# Patient Record
Sex: Female | Born: 1985 | Race: Asian | Hispanic: No | Marital: Married | Smoking: Never smoker
Health system: Southern US, Community
[De-identification: ages and names within clinical notes are randomized; demographics above are authoritative.]

## PROBLEM LIST (undated history)

## (undated) DIAGNOSIS — Z789 Other specified health status: Secondary | ICD-10-CM

## (undated) DIAGNOSIS — Z3183 Encounter for assisted reproductive fertility procedure cycle: Secondary | ICD-10-CM

## (undated) HISTORY — PX: POLYPECTOMY: SHX149

## (undated) HISTORY — DX: Other specified health status: Z78.9

---

## 2019-07-17 DIAGNOSIS — Z3141 Encounter for fertility testing: Secondary | ICD-10-CM | POA: Diagnosis not present

## 2019-07-17 DIAGNOSIS — N979 Female infertility, unspecified: Secondary | ICD-10-CM | POA: Diagnosis not present

## 2019-07-17 DIAGNOSIS — Z3183 Encounter for assisted reproductive fertility procedure cycle: Secondary | ICD-10-CM | POA: Diagnosis not present

## 2019-07-17 DIAGNOSIS — Z113 Encounter for screening for infections with a predominantly sexual mode of transmission: Secondary | ICD-10-CM | POA: Diagnosis not present

## 2019-07-18 DIAGNOSIS — N979 Female infertility, unspecified: Secondary | ICD-10-CM | POA: Diagnosis not present

## 2019-07-26 DIAGNOSIS — Z3141 Encounter for fertility testing: Secondary | ICD-10-CM | POA: Diagnosis not present

## 2019-08-14 DIAGNOSIS — Z113 Encounter for screening for infections with a predominantly sexual mode of transmission: Secondary | ICD-10-CM | POA: Diagnosis not present

## 2019-08-31 DIAGNOSIS — Z3200 Encounter for pregnancy test, result unknown: Secondary | ICD-10-CM | POA: Diagnosis not present

## 2019-08-31 DIAGNOSIS — Z3201 Encounter for pregnancy test, result positive: Secondary | ICD-10-CM | POA: Diagnosis not present

## 2019-09-04 DIAGNOSIS — Z3201 Encounter for pregnancy test, result positive: Secondary | ICD-10-CM | POA: Diagnosis not present

## 2019-09-04 DIAGNOSIS — Z3200 Encounter for pregnancy test, result unknown: Secondary | ICD-10-CM | POA: Diagnosis not present

## 2019-09-15 DIAGNOSIS — Z3200 Encounter for pregnancy test, result unknown: Secondary | ICD-10-CM | POA: Diagnosis not present

## 2019-10-02 DIAGNOSIS — Z3201 Encounter for pregnancy test, result positive: Secondary | ICD-10-CM | POA: Diagnosis not present

## 2019-10-23 DIAGNOSIS — O219 Vomiting of pregnancy, unspecified: Secondary | ICD-10-CM | POA: Diagnosis not present

## 2019-10-23 DIAGNOSIS — Z3400 Encounter for supervision of normal first pregnancy, unspecified trimester: Secondary | ICD-10-CM | POA: Diagnosis not present

## 2019-10-23 DIAGNOSIS — O09511 Supervision of elderly primigravida, first trimester: Secondary | ICD-10-CM | POA: Diagnosis not present

## 2019-10-23 DIAGNOSIS — Z3A11 11 weeks gestation of pregnancy: Secondary | ICD-10-CM | POA: Diagnosis not present

## 2019-10-23 DIAGNOSIS — O09811 Supervision of pregnancy resulting from assisted reproductive technology, first trimester: Secondary | ICD-10-CM | POA: Diagnosis not present

## 2019-10-23 DIAGNOSIS — Z3689 Encounter for other specified antenatal screening: Secondary | ICD-10-CM | POA: Diagnosis not present

## 2019-10-24 ENCOUNTER — Inpatient Hospital Stay (HOSPITAL_COMMUNITY): Admission: AD | Admit: 2019-10-24 | Payer: 59 | Source: Home / Self Care | Admitting: Obstetrics & Gynecology

## 2019-10-24 DIAGNOSIS — O09511 Supervision of elderly primigravida, first trimester: Secondary | ICD-10-CM | POA: Diagnosis not present

## 2019-10-24 DIAGNOSIS — Z3689 Encounter for other specified antenatal screening: Secondary | ICD-10-CM | POA: Diagnosis not present

## 2019-10-24 LAB — OB RESULTS CONSOLE HIV ANTIBODY (ROUTINE TESTING): HIV: NONREACTIVE

## 2019-10-24 LAB — OB RESULTS CONSOLE GC/CHLAMYDIA
Chlamydia: NEGATIVE
Gonorrhea: NEGATIVE

## 2019-10-24 LAB — OB RESULTS CONSOLE HEPATITIS B SURFACE ANTIGEN: Hepatitis B Surface Ag: NEGATIVE

## 2019-10-24 LAB — OB RESULTS CONSOLE ANTIBODY SCREEN: Antibody Screen: NEGATIVE

## 2019-10-24 LAB — OB RESULTS CONSOLE ABO/RH: RH Type: POSITIVE

## 2019-10-24 LAB — OB RESULTS CONSOLE RPR: RPR: NONREACTIVE

## 2019-10-24 LAB — OB RESULTS CONSOLE RUBELLA ANTIBODY, IGM: Rubella: IMMUNE

## 2019-10-29 ENCOUNTER — Other Ambulatory Visit: Payer: Self-pay

## 2019-10-29 ENCOUNTER — Inpatient Hospital Stay (HOSPITAL_COMMUNITY)
Admission: AD | Admit: 2019-10-29 | Discharge: 2019-10-29 | Disposition: A | Discharge status: Home / Self Care | Payer: 59 | Attending: Obstetrics & Gynecology | Admitting: Obstetrics & Gynecology

## 2019-10-29 ENCOUNTER — Encounter (HOSPITAL_COMMUNITY): Payer: Self-pay

## 2019-10-29 DIAGNOSIS — O219 Vomiting of pregnancy, unspecified: Secondary | ICD-10-CM | POA: Insufficient documentation

## 2019-10-29 DIAGNOSIS — Z79899 Other long term (current) drug therapy: Secondary | ICD-10-CM | POA: Diagnosis not present

## 2019-10-29 DIAGNOSIS — Z3A12 12 weeks gestation of pregnancy: Secondary | ICD-10-CM | POA: Insufficient documentation

## 2019-10-29 HISTORY — DX: Encounter for assisted reproductive fertility procedure cycle: Z31.83

## 2019-10-29 LAB — BASIC METABOLIC PANEL
Anion gap: 9 (ref 5–15)
BUN: 6 mg/dL (ref 6–20)
CO2: 20 mmol/L — ABNORMAL LOW (ref 22–32)
Calcium: 9.5 mg/dL (ref 8.9–10.3)
Chloride: 110 mmol/L (ref 98–111)
Creatinine, Ser: 0.57 mg/dL (ref 0.44–1.00)
GFR calc Af Amer: >60 mL/min (ref >60)
GFR calc non Af Amer: >60 mL/min (ref >60)
Glucose, Bld: 99 mg/dL (ref 70–99)
Potassium: 3.5 mmol/L (ref 3.5–5.1)
Sodium: 139 mmol/L (ref 135–145)

## 2019-10-29 LAB — CBC
HCT: 36.3 % (ref 36.0–46.0)
Hemoglobin: 12.4 g/dL (ref 12.0–15.0)
MCH: 29.5 pg (ref 26.0–34.0)
MCHC: 34.2 g/dL (ref 30.0–36.0)
MCV: 86.2 fL (ref 80.0–100.0)
Platelets: 308 10*3/uL (ref 150–400)
RBC: 4.21 MIL/uL (ref 3.87–5.11)
RDW: 12.7 % (ref 11.5–15.5)
WBC: 13.3 10*3/uL — ABNORMAL HIGH (ref 4.0–10.5)
nRBC: 0 % (ref 0.0–0.2)

## 2019-10-29 LAB — URINALYSIS, ROUTINE W REFLEX MICROSCOPIC
Bacteria, UA: NONE SEEN
Bilirubin Urine: NEGATIVE
Glucose, UA: NEGATIVE mg/dL
Hgb urine dipstick: NEGATIVE
Ketones, ur: 80 mg/dL — AB
Leukocytes,Ua: NEGATIVE
Nitrite: NEGATIVE
Protein, ur: 100 mg/dL — AB
Specific Gravity, Urine: 1.027 (ref 1.005–1.030)
pH: 7 (ref 5.0–8.0)

## 2019-10-29 MED ORDER — SCOPOLAMINE 1 MG/3DAYS TD PT72: 1.0000 | MEDICATED_PATCH | TRANSDERMAL | 12 refills | Status: DC

## 2019-10-29 MED ORDER — DOCUSATE SODIUM 250 MG PO CAPS
250.0000 mg | ORAL_CAPSULE | Freq: Every day | ORAL | 0 refills | Status: DC
Start: 2019-10-29 — End: 2020-05-07

## 2019-10-29 MED ORDER — M.V.I. ADULT IV INJ
Freq: Once | INTRAVENOUS | Status: AC
  Filled 2019-10-29: qty 10

## 2019-10-29 MED ORDER — ONDANSETRON HCL 4 MG/2ML IJ SOLN
4.0000 mg | Freq: Once | INTRAMUSCULAR | Status: AC
  Administered 2019-10-29: 4 mg via INTRAVENOUS
  Filled 2019-10-29: qty 2

## 2019-10-29 MED ORDER — PROMETHAZINE HCL 25 MG/ML IJ SOLN
25.0000 mg | Freq: Once | INTRAMUSCULAR | Status: AC
  Administered 2019-10-29: 25 mg via INTRAVENOUS
  Filled 2019-10-29: qty 1

## 2019-10-29 MED ORDER — SCOPOLAMINE 1 MG/3DAYS TD PT72
1.0000 | MEDICATED_PATCH | TRANSDERMAL | Status: DC
  Administered 2019-10-29: 1.5 mg via TRANSDERMAL
  Filled 2019-10-29: qty 1

## 2019-10-29 MED ORDER — LACTATED RINGERS IV BOLUS
1000.0000 mL | Freq: Once | INTRAVENOUS | Status: AC
  Administered 2019-10-29: 1000 mL via INTRAVENOUS

## 2019-10-29 MED ORDER — ONDANSETRON 8 MG PO TBDP
8.0000 mg | ORAL_TABLET | Freq: Three times a day (TID) | ORAL | 1 refills | Status: DC | PRN
Start: 2019-10-29 — End: 2020-05-07

## 2019-10-29 MED ORDER — FAMOTIDINE IN NACL 20-0.9 MG/50ML-% IV SOLN
20.0000 mg | Freq: Once | INTRAVENOUS | Status: AC
  Administered 2019-10-29: 20 mg via INTRAVENOUS
  Filled 2019-10-29: qty 50

## 2019-10-29 MED ORDER — PROMETHAZINE HCL 25 MG PO TABS
25.0000 mg | ORAL_TABLET | Freq: Four times a day (QID) | ORAL | 1 refills | Status: DC | PRN
Start: 2019-10-29 — End: 2020-05-07

## 2019-10-29 MED ORDER — FAMOTIDINE 20 MG PO TABS
20.0000 mg | ORAL_TABLET | Freq: Two times a day (BID) | ORAL | 1 refills | Status: DC
Start: 2019-10-29 — End: 2020-05-07

## 2019-10-29 NOTE — Discharge Instructions (Signed)
When to take your medicine next:   Zofran- 12:40am, take every 4-6 hours  Phenergan- 02:00am, take every 6 hours Pepcid- 08:00am, take twice a day at morning and bedtime Reglan- take at any time tonight if you feel like you need it and then every 6 hours.  Please take your medicine on a schedule until you feel like your nausea is getting better and you are able to eat and drink some. Please take all of the medicines around the clock.  Safe Medications in Pregnancy   Acne: Benzoyl Peroxide Salicylic Acid  Backache/Headache: Tylenol: 2 regular strength every 4 hours OR              2 Extra strength every 6 hours  Colds/Coughs/Allergies: Benadryl (alcohol free) 25 mg every 6 hours as needed Breath right strips Claritin Cepacol throat lozenges Chloraseptic throat spray Cold-Eeze- up to three times per day Cough drops, alcohol free Flonase (by prescription only) Guaifenesin Mucinex Robitussin DM (plain only, alcohol free) Saline nasal spray/drops Sudafed (pseudoephedrine) & Actifed ** use only after [redacted] weeks gestation and if you do not have high blood pressure Tylenol Vicks Vaporub Zinc lozenges Zyrtec   Constipation: Colace Ducolax suppositories Fleet enema Glycerin suppositories Metamucil Milk of magnesia Miralax Senokot Smooth move tea  Diarrhea: Kaopectate Imodium A-D  *NO pepto Bismol  Hemorrhoids: Anusol Anusol HC Preparation H Tucks  Indigestion: Tums Maalox Mylanta Zantac  Pepcid  Insomnia: Benadryl (alcohol free) 25mg  every 6 hours as needed Tylenol PM Unisom, no Gelcaps  Leg Cramps: Tums MagGel  Nausea/Vomiting:  Bonine Dramamine Emetrol Ginger extract Sea bands Meclizine  Nausea medication to take during pregnancy:  Unisom (doxylamine succinate 25 mg tablets) Take one tablet daily at bedtime. If symptoms are not adequately controlled, the dose can be increased to a maximum recommended dose of two tablets daily (1/2 tablet in  the morning, 1/2 tablet mid-afternoon and one at bedtime). Vitamin B6 100mg  tablets. Take one tablet twice a day (up to 200 mg per day).  Skin Rashes: Aveeno products Benadryl cream or 25mg  every 6 hours as needed Calamine Lotion 1% cortisone cream  Yeast infection: Gyne-lotrimin 7 Monistat 7   **If taking multiple medications, please check labels to avoid duplicating the same active ingredients **take medication as directed on the label ** Do not exceed 4000 mg of tylenol in 24 hours **Do not take medications that contain aspirin or ibuprofen

## 2019-10-29 NOTE — MAU Provider Note (Addendum)
History     CSN: [id number]  Arrival date and time: 10/29/19 7867   First Provider Initiated Contact with Patient 10/29/19 1935      Chief Complaint  Patient presents with  . Emesis During Pregnancy   HPI Amanda Cameron is a 34 y.o. G1P0 at [redacted]w[redacted]d who presents to MAU with chief complaint of recurrent severe vomiting. Patient estimates that she has vomited 25 times today. She has been prescribed Zofran and Reglan but has not experienced relief. She attempted to eat rice last night but was unable tolerate it.  She denies abdominal pain, vaginal bleeding, dysuria, fever or recent illness. She receives prenatal care with [facility] and s/p New OB visit 10/23/2019.  OB History    Gravida  1   Para      Term      Preterm      AB      Living        SAB      TAB      Ectopic      Multiple      Live Births              Past Medical History:  Diagnosis Date  . In vitro fertilization     Past Surgical History:  Procedure Laterality Date  . POLYPECTOMY     vaginal    Family History  Problem Relation Age of Onset  . Diabetes Mother   . Diabetes Father     Social History   Tobacco Use  . Smoking status: Never Smoker  . Smokeless tobacco: Never Used  Vaping Use  . Vaping Use: Never used  Substance Use Topics  . Alcohol use: Never  . Drug use: Never    Allergies: No Known Allergies  Medications Prior to Admission  Medication Sig Dispense Refill Last Dose  . clindamycin (CLEOCIN) 2 % vaginal cream Place 1 Applicatorful vaginally at bedtime.   10/28/2019 at Unknown time  . metoCLOPramide (REGLAN) 10 MG tablet Take 10 mg by mouth in the morning, at noon, and at bedtime.   10/29/2019 at Unknown time  . ondansetron (ZOFRAN) 4 MG tablet Take 4 mg by mouth every 8 (eight) hours as needed for nausea or vomiting.   10/29/2019 at Unknown time    Review of Systems  Gastrointestinal: Positive for constipation, nausea and vomiting. Negative for abdominal pain.   Genitourinary: Negative for vaginal bleeding.  Musculoskeletal: Negative for back pain.  Neurological: Negative for dizziness.  All other systems reviewed and are negative.  Physical Exam   Blood pressure 121/75, pulse 71, temperature 98 F (36.7 C), temperature source Oral, resp. rate 18, height 5\' 2"  (1.575 m), weight 84.4 kg, SpO2 100 %.  Physical Exam Vitals and nursing note reviewed. Exam conducted with a chaperone present.  Constitutional:      Appearance: Normal appearance.  HENT:     Mouth/Throat:     Mouth: Mucous membranes are dry.  Cardiovascular:     Rate and Rhythm: Normal rate.     Pulses: Normal pulses.  Pulmonary:     Effort: Pulmonary effort is normal.  Abdominal:     General: Abdomen is flat. Bowel sounds are normal.     Tenderness: There is no abdominal tenderness.  Skin:    Capillary Refill: Capillary refill takes less than 2 seconds.  Neurological:     Mental Status: She is alert.  Psychiatric:        Mood and Affect: Mood normal.  Thought Content: Thought content normal.        Judgment: Judgment normal.    FHT: 172 bpm  MAU Course  Procedures  Patient Vitals for the past 24 hrs:  BP Temp Temp src Pulse Resp SpO2 Height Weight  10/29/19 2233 121/75 -- -- 71 -- -- -- --  10/29/19 2225 -- -- -- -- -- 100 % -- --  10/29/19 2220 -- -- -- -- -- 100 % -- --  10/29/19 2215 -- -- -- -- -- 100 % -- --  10/29/19 2210 -- -- -- -- -- 99 % -- --  10/29/19 2200 -- -- -- -- -- 100 % -- --  10/29/19 2155 -- -- -- -- -- 100 % -- --  10/29/19 2150 -- -- -- -- -- 100 % -- --  10/29/19 2145 -- -- -- -- -- 100 % -- --  10/29/19 2140 -- -- -- -- -- 100 % -- --  10/29/19 1935 -- -- -- -- -- 98 % -- --  10/29/19 1930 -- -- -- -- -- 98 % -- --  10/29/19 1928 119/77 98 F (36.7 C) Oral 87 18 99 % 5\' 2"  (1.575 m) 84.4 kg   Orders Placed This Encounter  Procedures  . Urinalysis, Routine w reflex microscopic Urine, Clean Catch  . CBC  . Basic metabolic  panel  . Insert peripheral IV  . Discharge patient   Meds ordered this encounter  Medications  . lactated ringers bolus 1,000 mL  . promethazine (PHENERGAN) injection 25 mg  . famotidine (PEPCID) IVPB 20 mg premix  . multivitamins adult (INFUVITE ADULT) 10 mL in dextrose 5% lactated ringers 1,000 mL infusion  . ondansetron (ZOFRAN) injection 4 mg  . scopolamine (TRANSDERM-SCOP) 1 MG/3DAYS 1.5 mg  . ondansetron (ZOFRAN ODT) 8 MG disintegrating tablet    Sig: Take 1 tablet (8 mg total) by mouth every 8 (eight) hours as needed for nausea or vomiting.    Dispense:  30 tablet    Refill:  1    Order Specific Question:   Supervising Provider    Answer:   A [[id number]]  . promethazine (PHENERGAN) 25 MG tablet    Sig: Take 1 tablet (25 mg total) by mouth every 6 (six) hours as needed for nausea or vomiting.    Dispense:  30 tablet    Refill:  1    Order Specific Question:   Supervising Provider    Answer:   Jaynie Collins A [[id number]]  . famotidine (PEPCID) 20 MG tablet    Sig: Take 1 tablet (20 mg total) by mouth 2 (two) times daily.    Dispense:  60 tablet    Refill:  1    Order Specific Question:   Supervising Provider    Answer:   Jaynie Collins A [[id number]]  . scopolamine (TRANSDERM-SCOP, 1.5 MG,) 1 MG/3DAYS    Sig: Place 1 patch (1.5 mg total) onto the skin every 3 (three) days.    Dispense:  10 patch    Refill:  12    Order Specific Question:   Supervising Provider    Answer:   Jaynie Collins A [[id number]]  . docusate sodium (COLACE) 250 MG capsule    Sig: Take 1 capsule (250 mg total) by mouth daily.    Dispense:  30 capsule    Refill:  0    Order Specific Question:   Supervising Provider    Answer:   Jaynie Collins A [[id number]]   --Patient  unable to provide sample on arrival.   Report given to C. Druscilla BrownieNeill, CNM who assumes care of patient at this time.  Clayton BiblesSamantha Weinhold, MSN, CNM Certified Nurse Midwife, [facility]Faculty Practice Center for [facility], [facility] 10/29/19 10:46 PM    Results for orders placed or performed during the hospital encounter of 10/29/19 (from the past 24 hour(s))  Urinalysis, Routine w reflex microscopic Urine, Clean Catch     Status: Abnormal   Collection Time: 10/29/19  7:24 PM  Result Value Ref Range   Color, Urine AMBER (A) YELLOW   APPearance HAZY (A) CLEAR   Specific Gravity, Urine 1.027 1.005 - 1.030   pH 7.0 5.0 - 8.0   Glucose, UA NEGATIVE NEGATIVE mg/dL   Hgb urine dipstick NEGATIVE NEGATIVE   Bilirubin Urine NEGATIVE NEGATIVE   Ketones, ur 80 (A) NEGATIVE mg/dL   Protein, ur 454100 (A) NEGATIVE mg/dL   Nitrite NEGATIVE NEGATIVE   Leukocytes,Ua NEGATIVE NEGATIVE   RBC / HPF 0-5 0 - 5 RBC/hpf   WBC, UA 0-5 0 - 5 WBC/hpf   Bacteria, UA NONE SEEN NONE SEEN   Squamous Epithelial / LPF 6-10 0 - 5   Mucus PRESENT    Hyaline Casts, UA PRESENT   CBC     Status: Abnormal   Collection Time: 10/29/19  8:08 PM  Result Value Ref Range   WBC 13.3 (H) 4.0 - 10.5 K/uL   RBC 4.21 3.87 - 5.11 MIL/uL   Hemoglobin 12.4 12.0 - 15.0 g/dL   HCT 09.836.3 36 - 46 %   MCV 86.2 80.0 - 100.0 fL   MCH 29.5 26.0 - 34.0 pg   MCHC 34.2 30.0 - 36.0 g/dL   RDW 11.912.7 14.711.5 - 82.915.5 %   Platelets 308 150 - 400 K/uL   nRBC 0.0 0.0 - 0.2 %  Basic metabolic panel     Status: Abnormal   Collection Time: 10/29/19  8:08 PM  Result Value Ref Range   Sodium 139 135 - 145 mmol/L   Potassium 3.5 3.5 - 5.1 mmol/L   Chloride 110 98 - 111 mmol/L   CO2 20 (L) 22 - 32 mmol/L   Glucose, Bld 99 70 - 99 mg/dL   BUN 6 6 - 20 mg/dL   Creatinine, Ser 5.620.57 0.44 - 1.00 mg/dL   Calcium 9.5 8.9 - 13.010.3 mg/dL   GFR calc non Af Amer >60 >60 mL/min   GFR calc Af Amer >60 >60 mL/min   Anion gap 9 5 - 15    Patient reports no improvement of nausea since phenergan and only feels sleepy. Will give additional medications  Pepcid IV Zofran IV Scopolamine patch  Patient states her nausea is improved so far.   Multivitamin IV infusion. Patient no longer  vomiting and feels improvement in nausea.   Discussed with patient that expectation is not to not have nausea/vomiting but to improve symptoms and make it more manageable. Discussed importance of taking medication on a schedule until symptoms start to improve and she is able to tolerate PO food and water.  Assessment and Plan   1. Nausea and vomiting during pregnancy   2. [redacted] weeks gestation of pregnancy    -Discharge home in stable condition -Rx for zofran, pepcid, phenergan and colace sent to patient's pharmacy -Nausea and vomiting precautions discussed -Patient advised to follow-up with OB as scheduled for prenatal care -Patient may return to MAU as needed or if her condition were to change or  worsen   Rolm Bookbinder, [place] 10/29/19 10:48 PM

## 2019-10-29 NOTE — MAU Note (Addendum)
Tried prescriptions: Reglan and Zofran today for vomiting but not helping.  Vomited at least 25 times since 6 am.   No abd pain.  No vaginal bleeding. Having constipation due to pregnancy and zofran and tried metamucil but not helping. Last BM was yesterday.

## 2019-10-30 ENCOUNTER — Other Ambulatory Visit: Payer: Self-pay

## 2019-10-31 ENCOUNTER — Other Ambulatory Visit (HOSPITAL_COMMUNITY): Payer: Self-pay | Admitting: Obstetrics & Gynecology

## 2019-10-31 ENCOUNTER — Ambulatory Visit (HOSPITAL_COMMUNITY)
Admission: RE | Admit: 2019-10-31 | Discharge: 2019-10-31 | Disposition: A | Discharge status: Home / Self Care | Payer: 59 | Source: Ambulatory Visit | Attending: Obstetrics & Gynecology | Admitting: Obstetrics & Gynecology

## 2019-10-31 ENCOUNTER — Other Ambulatory Visit: Payer: Self-pay

## 2019-10-31 DIAGNOSIS — R1011 Right upper quadrant pain: Secondary | ICD-10-CM | POA: Insufficient documentation

## 2019-10-31 DIAGNOSIS — R112 Nausea with vomiting, unspecified: Secondary | ICD-10-CM | POA: Diagnosis not present

## 2019-11-20 DIAGNOSIS — N970 Female infertility associated with anovulation: Secondary | ICD-10-CM | POA: Diagnosis not present

## 2019-11-20 DIAGNOSIS — Z3686 Encounter for antenatal screening for cervical length: Secondary | ICD-10-CM | POA: Diagnosis not present

## 2019-11-20 DIAGNOSIS — Z361 Encounter for antenatal screening for raised alphafetoprotein level: Secondary | ICD-10-CM | POA: Diagnosis not present

## 2019-12-18 DIAGNOSIS — O09512 Supervision of elderly primigravida, second trimester: Secondary | ICD-10-CM | POA: Diagnosis not present

## 2019-12-18 DIAGNOSIS — O09812 Supervision of pregnancy resulting from assisted reproductive technology, second trimester: Secondary | ICD-10-CM | POA: Diagnosis not present

## 2019-12-18 DIAGNOSIS — Z3A19 19 weeks gestation of pregnancy: Secondary | ICD-10-CM | POA: Diagnosis not present

## 2020-01-04 DIAGNOSIS — O09812 Supervision of pregnancy resulting from assisted reproductive technology, second trimester: Secondary | ICD-10-CM | POA: Diagnosis not present

## 2020-01-04 DIAGNOSIS — O4442 Low lying placenta NOS or without hemorrhage, second trimester: Secondary | ICD-10-CM | POA: Diagnosis not present

## 2020-01-04 DIAGNOSIS — Z3A21 21 weeks gestation of pregnancy: Secondary | ICD-10-CM | POA: Diagnosis not present

## 2020-01-04 DIAGNOSIS — O09512 Supervision of elderly primigravida, second trimester: Secondary | ICD-10-CM | POA: Diagnosis not present

## 2020-01-30 DIAGNOSIS — Z23 Encounter for immunization: Secondary | ICD-10-CM | POA: Diagnosis not present

## 2020-02-20 DIAGNOSIS — Z3689 Encounter for other specified antenatal screening: Secondary | ICD-10-CM | POA: Diagnosis not present

## 2020-03-08 DIAGNOSIS — O43123 Velamentous insertion of umbilical cord, third trimester: Secondary | ICD-10-CM | POA: Diagnosis not present

## 2020-03-08 DIAGNOSIS — Z3A31 31 weeks gestation of pregnancy: Secondary | ICD-10-CM | POA: Diagnosis not present

## 2020-03-08 DIAGNOSIS — Z23 Encounter for immunization: Secondary | ICD-10-CM | POA: Diagnosis not present

## 2020-03-08 DIAGNOSIS — O365930 Maternal care for other known or suspected poor fetal growth, third trimester, not applicable or unspecified: Secondary | ICD-10-CM | POA: Diagnosis not present

## 2020-03-08 DIAGNOSIS — O09523 Supervision of elderly multigravida, third trimester: Secondary | ICD-10-CM | POA: Diagnosis not present

## 2020-03-08 DIAGNOSIS — Z3689 Encounter for other specified antenatal screening: Secondary | ICD-10-CM | POA: Diagnosis not present

## 2020-03-08 DIAGNOSIS — Z3A30 30 weeks gestation of pregnancy: Secondary | ICD-10-CM | POA: Diagnosis not present

## 2020-03-08 DIAGNOSIS — O26843 Uterine size-date discrepancy, third trimester: Secondary | ICD-10-CM | POA: Diagnosis not present

## 2020-03-08 DIAGNOSIS — O09819 Supervision of pregnancy resulting from assisted reproductive technology, unspecified trimester: Secondary | ICD-10-CM | POA: Diagnosis not present

## 2020-03-08 DIAGNOSIS — O210 Mild hyperemesis gravidarum: Secondary | ICD-10-CM | POA: Diagnosis not present

## 2020-03-13 DIAGNOSIS — Z3A31 31 weeks gestation of pregnancy: Secondary | ICD-10-CM | POA: Diagnosis not present

## 2020-03-13 DIAGNOSIS — Z3689 Encounter for other specified antenatal screening: Secondary | ICD-10-CM | POA: Diagnosis not present

## 2020-03-13 DIAGNOSIS — O43123 Velamentous insertion of umbilical cord, third trimester: Secondary | ICD-10-CM | POA: Diagnosis not present

## 2020-03-13 DIAGNOSIS — O2613 Low weight gain in pregnancy, third trimester: Secondary | ICD-10-CM | POA: Diagnosis not present

## 2020-03-13 DIAGNOSIS — O365930 Maternal care for other known or suspected poor fetal growth, third trimester, not applicable or unspecified: Secondary | ICD-10-CM | POA: Diagnosis not present

## 2020-03-13 DIAGNOSIS — O210 Mild hyperemesis gravidarum: Secondary | ICD-10-CM | POA: Diagnosis not present

## 2020-03-13 DIAGNOSIS — Z3403 Encounter for supervision of normal first pregnancy, third trimester: Secondary | ICD-10-CM | POA: Diagnosis not present

## 2020-03-21 DIAGNOSIS — O210 Mild hyperemesis gravidarum: Secondary | ICD-10-CM | POA: Diagnosis not present

## 2020-03-21 DIAGNOSIS — O09813 Supervision of pregnancy resulting from assisted reproductive technology, third trimester: Secondary | ICD-10-CM | POA: Diagnosis not present

## 2020-03-21 DIAGNOSIS — O365930 Maternal care for other known or suspected poor fetal growth, third trimester, not applicable or unspecified: Secondary | ICD-10-CM | POA: Diagnosis not present

## 2020-03-21 DIAGNOSIS — O365932 Maternal care for other known or suspected poor fetal growth, third trimester, fetus 2: Secondary | ICD-10-CM | POA: Diagnosis not present

## 2020-03-21 DIAGNOSIS — Z3A32 32 weeks gestation of pregnancy: Secondary | ICD-10-CM | POA: Diagnosis not present

## 2020-03-21 DIAGNOSIS — O43123 Velamentous insertion of umbilical cord, third trimester: Secondary | ICD-10-CM | POA: Diagnosis not present

## 2020-03-21 DIAGNOSIS — O09523 Supervision of elderly multigravida, third trimester: Secondary | ICD-10-CM | POA: Diagnosis not present

## 2020-03-28 DIAGNOSIS — O09813 Supervision of pregnancy resulting from assisted reproductive technology, third trimester: Secondary | ICD-10-CM | POA: Diagnosis not present

## 2020-03-28 DIAGNOSIS — O210 Mild hyperemesis gravidarum: Secondary | ICD-10-CM | POA: Diagnosis not present

## 2020-03-28 DIAGNOSIS — O09513 Supervision of elderly primigravida, third trimester: Secondary | ICD-10-CM | POA: Diagnosis not present

## 2020-03-28 DIAGNOSIS — O43123 Velamentous insertion of umbilical cord, third trimester: Secondary | ICD-10-CM | POA: Diagnosis not present

## 2020-03-28 DIAGNOSIS — Z3403 Encounter for supervision of normal first pregnancy, third trimester: Secondary | ICD-10-CM | POA: Diagnosis not present

## 2020-03-28 DIAGNOSIS — Z3A33 33 weeks gestation of pregnancy: Secondary | ICD-10-CM | POA: Diagnosis not present

## 2020-03-28 DIAGNOSIS — O365930 Maternal care for other known or suspected poor fetal growth, third trimester, not applicable or unspecified: Secondary | ICD-10-CM | POA: Diagnosis not present

## 2020-04-05 DIAGNOSIS — O365930 Maternal care for other known or suspected poor fetal growth, third trimester, not applicable or unspecified: Secondary | ICD-10-CM | POA: Diagnosis not present

## 2020-04-05 DIAGNOSIS — O09813 Supervision of pregnancy resulting from assisted reproductive technology, third trimester: Secondary | ICD-10-CM | POA: Diagnosis not present

## 2020-04-05 DIAGNOSIS — Z3A34 34 weeks gestation of pregnancy: Secondary | ICD-10-CM | POA: Diagnosis not present

## 2020-04-05 DIAGNOSIS — O43123 Velamentous insertion of umbilical cord, third trimester: Secondary | ICD-10-CM | POA: Diagnosis not present

## 2020-04-11 DIAGNOSIS — Z3685 Encounter for antenatal screening for Streptococcus B: Secondary | ICD-10-CM | POA: Diagnosis not present

## 2020-04-11 DIAGNOSIS — Z3A35 35 weeks gestation of pregnancy: Secondary | ICD-10-CM | POA: Diagnosis not present

## 2020-04-11 DIAGNOSIS — O365930 Maternal care for other known or suspected poor fetal growth, third trimester, not applicable or unspecified: Secondary | ICD-10-CM | POA: Diagnosis not present

## 2020-04-11 DIAGNOSIS — O43123 Velamentous insertion of umbilical cord, third trimester: Secondary | ICD-10-CM | POA: Diagnosis not present

## 2020-04-11 DIAGNOSIS — O210 Mild hyperemesis gravidarum: Secondary | ICD-10-CM | POA: Diagnosis not present

## 2020-04-11 LAB — OB RESULTS CONSOLE GBS: GBS: NEGATIVE

## 2020-04-19 DIAGNOSIS — Z3A36 36 weeks gestation of pregnancy: Secondary | ICD-10-CM | POA: Diagnosis not present

## 2020-04-19 DIAGNOSIS — O09513 Supervision of elderly primigravida, third trimester: Secondary | ICD-10-CM | POA: Diagnosis not present

## 2020-04-19 DIAGNOSIS — O365930 Maternal care for other known or suspected poor fetal growth, third trimester, not applicable or unspecified: Secondary | ICD-10-CM | POA: Diagnosis not present

## 2020-04-19 DIAGNOSIS — O09813 Supervision of pregnancy resulting from assisted reproductive technology, third trimester: Secondary | ICD-10-CM | POA: Diagnosis not present

## 2020-04-19 DIAGNOSIS — O43123 Velamentous insertion of umbilical cord, third trimester: Secondary | ICD-10-CM | POA: Diagnosis not present

## 2020-04-26 DIAGNOSIS — O43123 Velamentous insertion of umbilical cord, third trimester: Secondary | ICD-10-CM | POA: Diagnosis not present

## 2020-04-26 DIAGNOSIS — O365930 Maternal care for other known or suspected poor fetal growth, third trimester, not applicable or unspecified: Secondary | ICD-10-CM | POA: Diagnosis not present

## 2020-04-26 DIAGNOSIS — O210 Mild hyperemesis gravidarum: Secondary | ICD-10-CM | POA: Diagnosis not present

## 2020-04-26 DIAGNOSIS — O09513 Supervision of elderly primigravida, third trimester: Secondary | ICD-10-CM | POA: Diagnosis not present

## 2020-04-26 DIAGNOSIS — O09813 Supervision of pregnancy resulting from assisted reproductive technology, third trimester: Secondary | ICD-10-CM | POA: Diagnosis not present

## 2020-04-26 DIAGNOSIS — Z3A37 37 weeks gestation of pregnancy: Secondary | ICD-10-CM | POA: Diagnosis not present

## 2020-05-01 ENCOUNTER — Encounter (HOSPITAL_COMMUNITY): Payer: Self-pay | Admitting: *Deleted

## 2020-05-01 ENCOUNTER — Telehealth (HOSPITAL_COMMUNITY): Payer: Self-pay | Admitting: *Deleted

## 2020-05-01 NOTE — Telephone Encounter (Signed)
Preadmission screen  

## 2020-05-03 ENCOUNTER — Other Ambulatory Visit: Payer: Self-pay | Admitting: Obstetrics & Gynecology

## 2020-05-03 DIAGNOSIS — O365930 Maternal care for other known or suspected poor fetal growth, third trimester, not applicable or unspecified: Secondary | ICD-10-CM | POA: Diagnosis not present

## 2020-05-03 DIAGNOSIS — Z3A38 38 weeks gestation of pregnancy: Secondary | ICD-10-CM | POA: Diagnosis not present

## 2020-05-04 ENCOUNTER — Other Ambulatory Visit (HOSPITAL_COMMUNITY)
Admission: RE | Admit: 2020-05-04 | Discharge: 2020-05-04 | Disposition: A | Discharge status: Home / Self Care | Payer: 59 | Source: Ambulatory Visit | Attending: Obstetrics & Gynecology | Admitting: Obstetrics & Gynecology

## 2020-05-04 DIAGNOSIS — Z01812 Encounter for preprocedural laboratory examination: Secondary | ICD-10-CM | POA: Insufficient documentation

## 2020-05-04 DIAGNOSIS — Z20822 Contact with and (suspected) exposure to covid-19: Secondary | ICD-10-CM | POA: Insufficient documentation

## 2020-05-04 LAB — SARS CORONAVIRUS 2 (TAT 6-24 HRS): SARS Coronavirus 2: NEGATIVE

## 2020-05-07 ENCOUNTER — Inpatient Hospital Stay (HOSPITAL_COMMUNITY): Payer: 59

## 2020-05-07 ENCOUNTER — Other Ambulatory Visit: Payer: Self-pay

## 2020-05-07 ENCOUNTER — Encounter (HOSPITAL_COMMUNITY): Payer: Self-pay | Admitting: Obstetrics & Gynecology

## 2020-05-07 ENCOUNTER — Inpatient Hospital Stay (HOSPITAL_COMMUNITY)
Admission: RE | Admit: 2020-05-07 | Discharge: 2020-05-11 | DRG: 787 | Disposition: A | Discharge status: Home / Self Care | Payer: 59 | Attending: Obstetrics & Gynecology | Admitting: Obstetrics & Gynecology

## 2020-05-07 DIAGNOSIS — Z3A39 39 weeks gestation of pregnancy: Secondary | ICD-10-CM | POA: Diagnosis not present

## 2020-05-07 DIAGNOSIS — O365930 Maternal care for other known or suspected poor fetal growth, third trimester, not applicable or unspecified: Secondary | ICD-10-CM | POA: Diagnosis not present

## 2020-05-07 DIAGNOSIS — D62 Acute posthemorrhagic anemia: Secondary | ICD-10-CM | POA: Diagnosis not present

## 2020-05-07 DIAGNOSIS — O9081 Anemia of the puerperium: Secondary | ICD-10-CM | POA: Diagnosis not present

## 2020-05-07 DIAGNOSIS — Z3490 Encounter for supervision of normal pregnancy, unspecified, unspecified trimester: Secondary | ICD-10-CM | POA: Diagnosis present

## 2020-05-07 DIAGNOSIS — Z20822 Contact with and (suspected) exposure to covid-19: Secondary | ICD-10-CM | POA: Diagnosis present

## 2020-05-07 DIAGNOSIS — E669 Obesity, unspecified: Secondary | ICD-10-CM | POA: Diagnosis present

## 2020-05-07 DIAGNOSIS — O43123 Velamentous insertion of umbilical cord, third trimester: Secondary | ICD-10-CM | POA: Diagnosis not present

## 2020-05-07 DIAGNOSIS — Z9189 Other specified personal risk factors, not elsewhere classified: Secondary | ICD-10-CM

## 2020-05-07 DIAGNOSIS — O99214 Obesity complicating childbirth: Secondary | ICD-10-CM | POA: Diagnosis not present

## 2020-05-07 DIAGNOSIS — O365990 Maternal care for other known or suspected poor fetal growth, unspecified trimester, not applicable or unspecified: Secondary | ICD-10-CM | POA: Diagnosis not present

## 2020-05-07 DIAGNOSIS — O99892 Other specified diseases and conditions complicating childbirth: Secondary | ICD-10-CM | POA: Diagnosis present

## 2020-05-07 LAB — TYPE AND SCREEN
ABO/RH(D): A POS
Antibody Screen: NEGATIVE

## 2020-05-07 LAB — CBC
HCT: 36.3 % (ref 36.0–46.0)
Hemoglobin: 12.7 g/dL (ref 12.0–15.0)
MCH: 30.3 pg (ref 26.0–34.0)
MCHC: 35 g/dL (ref 30.0–36.0)
MCV: 86.6 fL (ref 80.0–100.0)
Platelets: 253 10*3/uL (ref 150–400)
RBC: 4.19 MIL/uL (ref 3.87–5.11)
RDW: 13.2 % (ref 11.5–15.5)
WBC: 13.6 10*3/uL — ABNORMAL HIGH (ref 4.0–10.5)
nRBC: 0 % (ref 0.0–0.2)

## 2020-05-07 LAB — RPR: RPR Ser Ql: NONREACTIVE

## 2020-05-07 MED ORDER — BUTORPHANOL TARTRATE 1 MG/ML IJ SOLN
1.0000 mg | Freq: Once | INTRAMUSCULAR | Status: AC
  Administered 2020-05-07: 1 mg via INTRAVENOUS
  Filled 2020-05-07: qty 1

## 2020-05-07 MED ORDER — MISOPROSTOL 25 MCG QUARTER TABLET
25.0000 ug | ORAL_TABLET | ORAL | Status: DC | PRN
  Administered 2020-05-07: 25 ug via VAGINAL
  Filled 2020-05-07: qty 1

## 2020-05-07 MED ORDER — LACTATED RINGERS IV SOLN: INTRAVENOUS | Status: DC

## 2020-05-07 MED ORDER — SOD CITRATE-CITRIC ACID 500-334 MG/5ML PO SOLN: 30.0000 mL | ORAL | Status: DC | PRN

## 2020-05-07 MED ORDER — DIPHENHYDRAMINE HCL 50 MG/ML IJ SOLN: 12.5000 mg | INTRAMUSCULAR | Status: DC | PRN

## 2020-05-07 MED ORDER — OXYTOCIN-SODIUM CHLORIDE 30-0.9 UT/500ML-% IV SOLN
2.5000 [IU]/h | INTRAVENOUS | Status: DC
  Filled 2020-05-07: qty 500

## 2020-05-07 MED ORDER — LIDOCAINE HCL (PF) 1 % IJ SOLN: 30.0000 mL | INTRAMUSCULAR | Status: DC | PRN

## 2020-05-07 MED ORDER — MISOPROSTOL 50MCG HALF TABLET
50.0000 ug | ORAL_TABLET | Freq: Once | ORAL | Status: AC
  Administered 2020-05-07: 50 ug via BUCCAL
  Filled 2020-05-07: qty 1

## 2020-05-07 MED ORDER — OXYTOCIN BOLUS FROM INFUSION: 333.0000 mL | Freq: Once | INTRAVENOUS | Status: DC

## 2020-05-07 MED ORDER — OXYTOCIN-SODIUM CHLORIDE 30-0.9 UT/500ML-% IV SOLN
1.0000 m[IU]/min | INTRAVENOUS | Status: DC
  Administered 2020-05-07: 2 m[IU]/min via INTRAVENOUS

## 2020-05-07 MED ORDER — EPHEDRINE 5 MG/ML INJ: 10.0000 mg | INTRAVENOUS | Status: DC | PRN

## 2020-05-07 MED ORDER — MISOPROSTOL 50MCG HALF TABLET
ORAL_TABLET | ORAL | Status: AC
  Filled 2020-05-07: qty 1

## 2020-05-07 MED ORDER — PHENYLEPHRINE 40 MCG/ML (10ML) SYRINGE FOR IV PUSH (FOR BLOOD PRESSURE SUPPORT): 80.0000 ug | PREFILLED_SYRINGE | INTRAVENOUS | Status: DC | PRN

## 2020-05-07 MED ORDER — FENTANYL-BUPIVACAINE-NACL 0.5-0.125-0.9 MG/250ML-% EP SOLN
12.0000 mL/h | EPIDURAL | Status: DC | PRN
  Administered 2020-05-08: 12 mL/h via EPIDURAL
  Filled 2020-05-07: qty 250

## 2020-05-07 MED ORDER — LACTATED RINGERS IV SOLN: 500.0000 mL | INTRAVENOUS | Status: DC | PRN

## 2020-05-07 MED ORDER — TERBUTALINE SULFATE 1 MG/ML IJ SOLN: 0.2500 mg | Freq: Once | INTRAMUSCULAR | Status: DC | PRN

## 2020-05-07 MED ORDER — ACETAMINOPHEN 325 MG PO TABS: 650.0000 mg | ORAL_TABLET | ORAL | Status: DC | PRN

## 2020-05-07 MED ORDER — ONDANSETRON HCL 4 MG/2ML IJ SOLN
4.0000 mg | Freq: Four times a day (QID) | INTRAMUSCULAR | Status: DC | PRN
  Administered 2020-05-07 (×2): 4 mg via INTRAVENOUS
  Filled 2020-05-07 (×2): qty 2

## 2020-05-07 MED ORDER — LACTATED RINGERS IV SOLN: 500.0000 mL | Freq: Once | INTRAVENOUS | Status: DC

## 2020-05-07 MED ORDER — MISOPROSTOL 50MCG HALF TABLET
50.0000 ug | ORAL_TABLET | Freq: Once | ORAL | Status: AC
  Administered 2020-05-07: 50 ug via BUCCAL

## 2020-05-07 NOTE — H&P (Addendum)
Amanda Cameron is a 35 y.o. female G1, 39.3 wks, EDC 05/05/20 by IVF dating, here for IOL for IUGR. Declined IOL last week. +FMs, no VB/ LOF/ UCs.  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  AMA, IVF, hyperemesis and poor nutrition, Marginal cord, IUGR diagnosed at 30 weeks.  AMA- nl NIPS, declined PGS due to cost IVF- serial CL nl at 16, 19 weeks  Hyperemesis- very poor food choices (mostly ate carbs)- 20 lbs weight gain, daily Zofran throughout pregnancy MCI/ IUGR - growth at 30 wks- 2'15" 3%, AC 6%, UAD nl, Growth at 33.5 wks- 4 lb, 5%, AC 4%, UAD nl, AFI nl Growth 36.6 wks - 5'5" 6%, AC 10%, UAD nl,  AFI 8.7, repeat AFI following week 10 cm  Wkly NST / BPP/ UAD since 31 weeks- wnl GBS(-)  OB History    Gravida  1   Para      Term      Preterm      AB      Living        SAB      IAB      Ectopic      Multiple      Live Births             Past Medical History:  Diagnosis Date  . In vitro fertilization   . Medical history non-contributory    Past Surgical History:  Procedure Laterality Date  . POLYPECTOMY     vaginal   Family History: family history includes Diabetes in her father and mother. Social History:  reports that she has never smoked. She has never used smokeless tobacco. She reports that she does not drink alcohol and does not use drugs.     Maternal Diabetes: No- passed 3hr GTT Genetic Screening: Normal NIPS Maternal Ultrasounds/Referrals: IUGR. Marginal cord  Fetal Ultrasounds or other Referrals:  None Maternal Substance Abuse:  No Significant Maternal Medications:  Meds include: Other:  Zofran throughout pregnancy Significant Maternal Lab Results:  Group B Strep negative Other Comments:  None  Review of Systems History Dilation: Closed Effacement (%): Thick Station: -2 Exam by:: B. Parks, RNC Blood pressure (!) 76/51, pulse 79, temperature 98.2 F (36.8 C), temperature source Oral, resp. rate 16, height 5\' 2"  (1.575 m), weight  97.8 kg. Exam Physical Exam  BP (!) 76/51   Pulse 79   Temp 98.2 F (36.8 C) (Oral)   Resp 16   Ht 5\' 2"  (1.575 m)   Wt 97.8 kg   BMI 39.42 kg/m  Physical exam:  A&O x 3, no acute distress. Pleasant HEENT neg, no thyromegaly Lungs CTA bilat CV RRR, S1S2 normal Abdo soft, non tender, non acute Extr no edema/ tenderness Pelvic cx closed, pelvis borderline, Stn -3 Vx FHT  130s mod variability/ + accels/no decels -cat I Toco none   Prenatal labs: ABO, Rh: --/--/A POS (02/15 0325) Antibody: NEG (02/15 0325) Rubella: Immune (08/03 0000) RPR: Nonreactive (08/03 0000)  HBsAg: Negative (08/03 0000)  HIV: Non-reactive (08/03 0000)  GBS:   Negative (04/11/20)  Assessment/Plan: 34 y G1, 39.3 wks, IOL for IUGR. AMA, IVF, Marginal cord. FHT cat I Cytotec 1-2 doses for unfavorable cervix then pitocin as needed. Borderline pelvis- but EFW 6-6.1/2 lbs, so possible for vaginal delivery IUGR- closely monitor Epidural planned, ok per pt request   03-25-1999 05/07/2020, 7:20 AM

## 2020-05-07 NOTE — Progress Notes (Addendum)
S: Resting in bed. Reports very mild cramping. Husband at the bedside. Expecting baby girl.   O: Vitals:   05/07/20 0258 05/07/20 0642 05/07/20 0742  BP: 122/74 (!) 76/51   Pulse: 87 79   Resp: 16 16 18   Temp: 98.9 F (37.2 C) 98.2 F (36.8 C) 97.8 F (36.6 C)  TempSrc: Oral Oral Oral  Weight: 97.8 kg    Height: 5\' 2"  (1.575 m)     FHT:  FHR: 130 bpm, variability: moderate,  accelerations:  Present,  decelerations:  Absent UC:   none SVE:   Dilation: Closed Effacement (%): 50 Station: -3 Exam by: A. Nihira Puello CNM  Intact Cytotec palpated right inside vaginal vault, cervix is closed  A / P: Induction of labor due to IUGR, s/p 1 dose of vaginal Cytotec  Fetal Wellbeing:  Category I Pain Control:  Labor support without medications Anticipated MOD:  Guarded   Will give of buccal Cytotec. Consider Foley balloon at next SVE if favorable.   Dr. updated with patient status and plan of care.   , CNM, MSN 05/07/2020, 8:29 AM

## 2020-05-07 NOTE — Progress Notes (Signed)
Amanda Cameron is a 35 y.o. G1P0 at [redacted]w[redacted]d by IVF dating,. IOL for IUGR  Subjective: cramping  Objective: BP 126/88   Pulse 88   Temp (!) 97 F (36.1 C) (Axillary)   Resp 18   Ht 5\' 2"  (1.575 m)   Wt 97.8 kg   BMI 39.42 kg/m  No intake/output data recorded. No intake/output data recorded.  FHT:  FHR: 130s bpm, variability: moderate,  accelerations:  Present,  decelerations:  Absent UC:   Irregular, rare  SVE:   1/30%/-4. Speculum placed, cervical balloon placed using Cook's with stilet. Tolerated okay (not very well due to pain with vaginal exams)    Assessment / Plan: IOL for IUGR, 3rd Cytotec at 1 pm. Now s/p cervical balloon, start pitocin at 5 pm in not in labor.  Pain mngmt d/w pt FHT cat I   05/07/2020, 5:51 PM

## 2020-05-08 ENCOUNTER — Inpatient Hospital Stay (HOSPITAL_COMMUNITY): Payer: 59 | Admitting: Anesthesiology

## 2020-05-08 ENCOUNTER — Encounter (HOSPITAL_COMMUNITY)
Admission: RE | Disposition: A | Discharge status: Home / Self Care | Payer: Self-pay | Source: Home / Self Care | Attending: Obstetrics & Gynecology

## 2020-05-08 ENCOUNTER — Encounter (HOSPITAL_COMMUNITY): Payer: Self-pay | Admitting: Obstetrics & Gynecology

## 2020-05-08 DIAGNOSIS — O99892 Other specified diseases and conditions complicating childbirth: Secondary | ICD-10-CM | POA: Diagnosis present

## 2020-05-08 SURGERY — Surgical Case
Anesthesia: Epidural

## 2020-05-08 MED ORDER — EPHEDRINE 5 MG/ML INJ: 10.0000 mg | INTRAVENOUS | Status: DC | PRN

## 2020-05-08 MED ORDER — POVIDONE-IODINE 10 % EX SWAB: 2.0000 "application " | Freq: Once | CUTANEOUS | Status: DC

## 2020-05-08 MED ORDER — MORPHINE SULFATE (PF) 0.5 MG/ML IJ SOLN
INTRAMUSCULAR | Status: AC
  Filled 2020-05-08: qty 10

## 2020-05-08 MED ORDER — NALBUPHINE HCL 10 MG/ML IJ SOLN
5.0000 mg | INTRAMUSCULAR | Status: DC | PRN
Start: 2020-05-08 — End: 2020-05-11

## 2020-05-08 MED ORDER — ZOLPIDEM TARTRATE 5 MG PO TABS: 5.0000 mg | ORAL_TABLET | Freq: Every evening | ORAL | Status: DC | PRN

## 2020-05-08 MED ORDER — PRENATAL MULTIVITAMIN CH
1.0000 | ORAL_TABLET | Freq: Every day | ORAL | Status: DC
  Administered 2020-05-09 – 2020-05-11 (×3): 1 via ORAL
  Filled 2020-05-08 (×4): qty 1

## 2020-05-08 MED ORDER — ONDANSETRON HCL 4 MG/2ML IJ SOLN
INTRAMUSCULAR | Status: DC | PRN
  Administered 2020-05-08: 4 mg via INTRAVENOUS

## 2020-05-08 MED ORDER — COCONUT OIL OIL: 1.0000 "application " | TOPICAL_OIL | Status: DC | PRN

## 2020-05-08 MED ORDER — WITCH HAZEL-GLYCERIN EX PADS: 1.0000 "application " | MEDICATED_PAD | CUTANEOUS | Status: DC | PRN

## 2020-05-08 MED ORDER — ONDANSETRON HCL 4 MG/2ML IJ SOLN
INTRAMUSCULAR | Status: AC
  Filled 2020-05-08: qty 2

## 2020-05-08 MED ORDER — PHENYLEPHRINE 40 MCG/ML (10ML) SYRINGE FOR IV PUSH (FOR BLOOD PRESSURE SUPPORT): 80.0000 ug | PREFILLED_SYRINGE | INTRAVENOUS | Status: DC | PRN

## 2020-05-08 MED ORDER — LACTATED RINGERS IV SOLN: 500.0000 mL | Freq: Once | INTRAVENOUS | Status: DC

## 2020-05-08 MED ORDER — MENTHOL 3 MG MT LOZG: 1.0000 | LOZENGE | OROMUCOSAL | Status: DC | PRN

## 2020-05-08 MED ORDER — LIDOCAINE HCL (PF) 1 % IJ SOLN
INTRAMUSCULAR | Status: DC | PRN
  Administered 2020-05-08: 10 mL via EPIDURAL

## 2020-05-08 MED ORDER — OXYCODONE HCL 5 MG PO TABS
5.0000 mg | ORAL_TABLET | ORAL | Status: DC | PRN
Start: 2020-05-08 — End: 2020-05-11
  Administered 2020-05-09 – 2020-05-11 (×7): 5 mg via ORAL
  Filled 2020-05-08 (×7): qty 1

## 2020-05-08 MED ORDER — OXYCODONE HCL 5 MG PO TABS
5.0000 mg | ORAL_TABLET | Freq: Once | ORAL | Status: DC | PRN
Start: 2020-05-08 — End: 2020-05-08

## 2020-05-08 MED ORDER — TETANUS-DIPHTH-ACELL PERTUSSIS 5-2.5-18.5 LF-MCG/0.5 IM SUSY: 0.5000 mL | PREFILLED_SYRINGE | Freq: Once | INTRAMUSCULAR | Status: DC

## 2020-05-08 MED ORDER — DEXAMETHASONE SODIUM PHOSPHATE 4 MG/ML IJ SOLN
INTRAMUSCULAR | Status: DC | PRN
  Administered 2020-05-08: 8 mg via INTRAVENOUS

## 2020-05-08 MED ORDER — SODIUM CHLORIDE 0.9% FLUSH: 3.0000 mL | INTRAVENOUS | Status: DC | PRN

## 2020-05-08 MED ORDER — NALBUPHINE HCL 10 MG/ML IJ SOLN: 5.0000 mg | Freq: Once | INTRAMUSCULAR | Status: DC | PRN

## 2020-05-08 MED ORDER — ACETAMINOPHEN 160 MG/5ML PO SOLN
325.0000 mg | ORAL | Status: DC | PRN
Start: 2020-05-08 — End: 2020-05-08

## 2020-05-08 MED ORDER — OXYTOCIN-SODIUM CHLORIDE 30-0.9 UT/500ML-% IV SOLN
2.5000 [IU]/h | INTRAVENOUS | Status: DC
  Administered 2020-05-08: 2.5 [IU]/h via INTRAVENOUS

## 2020-05-08 MED ORDER — OXYTOCIN-SODIUM CHLORIDE 30-0.9 UT/500ML-% IV SOLN
INTRAVENOUS | Status: AC
  Filled 2020-05-08: qty 500

## 2020-05-08 MED ORDER — NALOXONE HCL 0.4 MG/ML IJ SOLN
0.4000 mg | INTRAMUSCULAR | Status: DC | PRN
Start: 2020-05-08 — End: 2020-05-11

## 2020-05-08 MED ORDER — CHLOROPROCAINE HCL (PF) 3 % IJ SOLN
INTRAMUSCULAR | Status: DC | PRN
  Administered 2020-05-08: 20 mL

## 2020-05-08 MED ORDER — MEPERIDINE HCL 25 MG/ML IJ SOLN
6.2500 mg | INTRAMUSCULAR | Status: DC | PRN
Start: 2020-05-08 — End: 2020-05-08

## 2020-05-08 MED ORDER — KETOROLAC TROMETHAMINE 30 MG/ML IJ SOLN: 30.0000 mg | Freq: Four times a day (QID) | INTRAMUSCULAR | Status: DC | PRN

## 2020-05-08 MED ORDER — MEPERIDINE HCL 25 MG/ML IJ SOLN: 6.2500 mg | INTRAMUSCULAR | Status: DC | PRN

## 2020-05-08 MED ORDER — ONDANSETRON HCL 4 MG/2ML IJ SOLN: 4.0000 mg | Freq: Three times a day (TID) | INTRAMUSCULAR | Status: DC | PRN

## 2020-05-08 MED ORDER — OXYTOCIN-SODIUM CHLORIDE 30-0.9 UT/500ML-% IV SOLN
INTRAVENOUS | Status: DC | PRN
  Administered 2020-05-08: 300 mL via INTRAVENOUS

## 2020-05-08 MED ORDER — LACTATED RINGERS IV SOLN: INTRAVENOUS | Status: DC | PRN

## 2020-05-08 MED ORDER — KETOROLAC TROMETHAMINE 30 MG/ML IJ SOLN
30.0000 mg | Freq: Four times a day (QID) | INTRAMUSCULAR | Status: AC
  Administered 2020-05-08 – 2020-05-09 (×4): 30 mg via INTRAVENOUS
  Filled 2020-05-08 (×4): qty 1

## 2020-05-08 MED ORDER — ACETAMINOPHEN 325 MG PO TABS
325.0000 mg | ORAL_TABLET | ORAL | Status: DC | PRN
Start: 2020-05-08 — End: 2020-05-08

## 2020-05-08 MED ORDER — FENTANYL CITRATE (PF) 100 MCG/2ML IJ SOLN
INTRAMUSCULAR | Status: DC | PRN
  Administered 2020-05-08: 100 ug via EPIDURAL

## 2020-05-08 MED ORDER — OXYCODONE HCL 5 MG/5ML PO SOLN
5.0000 mg | Freq: Once | ORAL | Status: DC | PRN
Start: 2020-05-08 — End: 2020-05-08

## 2020-05-08 MED ORDER — DIPHENHYDRAMINE HCL 50 MG/ML IJ SOLN: 12.5000 mg | INTRAMUSCULAR | Status: DC | PRN

## 2020-05-08 MED ORDER — SCOPOLAMINE 1 MG/3DAYS TD PT72
MEDICATED_PATCH | TRANSDERMAL | Status: AC
  Filled 2020-05-08: qty 1

## 2020-05-08 MED ORDER — FENTANYL CITRATE (PF) 100 MCG/2ML IJ SOLN
25.0000 ug | INTRAMUSCULAR | Status: DC | PRN
Start: 2020-05-08 — End: 2020-05-08

## 2020-05-08 MED ORDER — DIPHENHYDRAMINE HCL 25 MG PO CAPS
25.0000 mg | ORAL_CAPSULE | ORAL | Status: DC | PRN
  Administered 2020-05-08: 25 mg via ORAL
  Filled 2020-05-08 (×2): qty 1

## 2020-05-08 MED ORDER — DIPHENHYDRAMINE HCL 25 MG PO CAPS
25.0000 mg | ORAL_CAPSULE | Freq: Four times a day (QID) | ORAL | Status: DC | PRN
  Administered 2020-05-08 – 2020-05-09 (×3): 25 mg via ORAL
  Filled 2020-05-08 (×2): qty 1

## 2020-05-08 MED ORDER — SCOPOLAMINE 1 MG/3DAYS TD PT72
1.0000 | MEDICATED_PATCH | Freq: Once | TRANSDERMAL | Status: AC
  Administered 2020-05-08: 1.5 mg via TRANSDERMAL

## 2020-05-08 MED ORDER — ONDANSETRON HCL 4 MG/2ML IJ SOLN: 4.0000 mg | Freq: Once | INTRAMUSCULAR | Status: DC | PRN

## 2020-05-08 MED ORDER — IBUPROFEN 800 MG PO TABS
800.0000 mg | ORAL_TABLET | Freq: Four times a day (QID) | ORAL | Status: DC
  Administered 2020-05-09 – 2020-05-11 (×8): 800 mg via ORAL
  Filled 2020-05-08 (×8): qty 1

## 2020-05-08 MED ORDER — METOCLOPRAMIDE HCL 5 MG/ML IJ SOLN
INTRAMUSCULAR | Status: DC | PRN
  Administered 2020-05-08: 10 mg via INTRAVENOUS

## 2020-05-08 MED ORDER — CEFAZOLIN SODIUM-DEXTROSE 2-4 GM/100ML-% IV SOLN: 2.0000 g | INTRAVENOUS | Status: DC

## 2020-05-08 MED ORDER — SENNOSIDES-DOCUSATE SODIUM 8.6-50 MG PO TABS
2.0000 | ORAL_TABLET | Freq: Every day | ORAL | Status: DC
  Administered 2020-05-09 – 2020-05-11 (×3): 2 via ORAL
  Filled 2020-05-08 (×3): qty 2

## 2020-05-08 MED ORDER — DIBUCAINE (PERIANAL) 1 % EX OINT: 1.0000 "application " | TOPICAL_OINTMENT | CUTANEOUS | Status: DC | PRN

## 2020-05-08 MED ORDER — DEXAMETHASONE SODIUM PHOSPHATE 4 MG/ML IJ SOLN
INTRAMUSCULAR | Status: AC
  Filled 2020-05-08: qty 2

## 2020-05-08 MED ORDER — FENTANYL CITRATE (PF) 100 MCG/2ML IJ SOLN
INTRAMUSCULAR | Status: AC
  Filled 2020-05-08: qty 2

## 2020-05-08 MED ORDER — FENTANYL-BUPIVACAINE-NACL 0.5-0.125-0.9 MG/250ML-% EP SOLN: 12.0000 mL/h | EPIDURAL | Status: DC | PRN

## 2020-05-08 MED ORDER — CEFAZOLIN SODIUM-DEXTROSE 2-3 GM-%(50ML) IV SOLR
INTRAVENOUS | Status: DC | PRN
  Administered 2020-05-08: 2 g via INTRAVENOUS

## 2020-05-08 MED ORDER — MORPHINE SULFATE (PF) 10 MG/ML IV SOLN
INTRAVENOUS | Status: DC | PRN
  Administered 2020-05-08: 3 mg via EPIDURAL

## 2020-05-08 MED ORDER — SODIUM CHLORIDE 0.9 % IR SOLN
Status: DC | PRN
  Administered 2020-05-08: 1

## 2020-05-08 MED ORDER — SODIUM BICARBONATE 8.4 % IV SOLN
INTRAVENOUS | Status: DC | PRN
  Administered 2020-05-08: 10 mL via EPIDURAL

## 2020-05-08 MED ORDER — STERILE WATER FOR IRRIGATION IR SOLN
Status: DC | PRN
  Administered 2020-05-08: 1

## 2020-05-08 MED ORDER — KETOROLAC TROMETHAMINE 30 MG/ML IJ SOLN
INTRAMUSCULAR | Status: AC
  Filled 2020-05-08: qty 1

## 2020-05-08 MED ORDER — PHENYLEPHRINE HCL (PRESSORS) 10 MG/ML IV SOLN
INTRAVENOUS | Status: DC | PRN
  Administered 2020-05-08: 80 ug via INTRAVENOUS

## 2020-05-08 MED ORDER — KETOROLAC TROMETHAMINE 30 MG/ML IJ SOLN
30.0000 mg | Freq: Four times a day (QID) | INTRAMUSCULAR | Status: DC | PRN
  Administered 2020-05-08: 30 mg via INTRAVENOUS

## 2020-05-08 MED ORDER — SIMETHICONE 80 MG PO CHEW: 80.0000 mg | CHEWABLE_TABLET | ORAL | Status: DC | PRN

## 2020-05-08 MED ORDER — NALOXONE HCL 4 MG/10ML IJ SOLN
1.0000 ug/kg/h | INTRAVENOUS | Status: DC | PRN
  Filled 2020-05-08: qty 5

## 2020-05-08 MED ORDER — ACETAMINOPHEN 325 MG PO TABS
650.0000 mg | ORAL_TABLET | ORAL | Status: DC | PRN
  Administered 2020-05-08 – 2020-05-09 (×3): 650 mg via ORAL
  Filled 2020-05-08 (×3): qty 2

## 2020-05-08 MED ORDER — SIMETHICONE 80 MG PO CHEW
80.0000 mg | CHEWABLE_TABLET | Freq: Three times a day (TID) | ORAL | Status: DC
  Administered 2020-05-08 – 2020-05-11 (×8): 80 mg via ORAL
  Filled 2020-05-08 (×8): qty 1

## 2020-05-08 SURGICAL SUPPLY — 34 items
BENZOIN TINCTURE PRP APPL 2/3 (GAUZE/BANDAGES/DRESSINGS) ×2 IMPLANT
CHLORAPREP W/TINT 26ML (MISCELLANEOUS) ×2 IMPLANT
CLAMP CORD UMBIL (MISCELLANEOUS) IMPLANT
CLOTH BEACON ORANGE TIMEOUT ST (SAFETY) ×2 IMPLANT
DRSG OPSITE POSTOP 4X10 (GAUZE/BANDAGES/DRESSINGS) ×2 IMPLANT
ELECT REM PT RETURN 9FT ADLT (ELECTROSURGICAL) ×2
ELECTRODE REM PT RTRN 9FT ADLT (ELECTROSURGICAL) ×1 IMPLANT
EXTRACTOR VACUUM KIWI (MISCELLANEOUS) IMPLANT
EXTRACTOR VACUUM M CUP 4 TUBE (SUCTIONS) IMPLANT
GLOVE BIO SURGEON STRL SZ7 (GLOVE) ×2 IMPLANT
GLOVE BIOGEL PI IND STRL 7.0 (GLOVE) ×2 IMPLANT
GLOVE BIOGEL PI INDICATOR 7.0 (GLOVE) ×2
GOWN STRL REUS W/TWL LRG LVL3 (GOWN DISPOSABLE) ×4 IMPLANT
KIT ABG SYR 3ML LUER SLIP (SYRINGE) IMPLANT
NEEDLE HYPO 25X5/8 SAFETYGLIDE (NEEDLE) IMPLANT
NS IRRIG 1000ML POUR BTL (IV SOLUTION) ×2 IMPLANT
PACK C SECTION WH (CUSTOM PROCEDURE TRAY) ×2 IMPLANT
PAD OB MATERNITY 4.3X12.25 (PERSONAL CARE ITEMS) ×2 IMPLANT
RTRCTR C-SECT PINK 25CM LRG (MISCELLANEOUS) IMPLANT
STRIP CLOSURE SKIN 1/2X4 (GAUZE/BANDAGES/DRESSINGS) ×2 IMPLANT
SUT MNCRL 0 VIOLET CTX 36 (SUTURE) ×2 IMPLANT
SUT MONOCRYL 0 CTX 36 (SUTURE) ×2
SUT PLAIN 0 NONE (SUTURE) IMPLANT
SUT PLAIN 2 0 (SUTURE)
SUT PLAIN ABS 2-0 CT1 27XMFL (SUTURE) IMPLANT
SUT VIC AB 0 CT1 27 (SUTURE) ×2
SUT VIC AB 0 CT1 27XBRD ANBCTR (SUTURE) ×2 IMPLANT
SUT VIC AB 2-0 CT1 27 (SUTURE) ×1
SUT VIC AB 2-0 CT1 TAPERPNT 27 (SUTURE) ×1 IMPLANT
SUT VIC AB 4-0 KS 27 (SUTURE) ×2 IMPLANT
SUT VICRYL 0 TIES 12 18 (SUTURE) IMPLANT
TOWEL OR 17X24 6PK STRL BLUE (TOWEL DISPOSABLE) ×2 IMPLANT
TRAY FOLEY W/BAG SLVR 14FR LF (SET/KITS/TRAYS/PACK) IMPLANT
WATER STERILE IRR 1000ML POUR (IV SOLUTION) ×2 IMPLANT

## 2020-05-08 NOTE — Anesthesia Procedure Notes (Signed)
Epidural Patient location during procedure: OB Start time: 05/08/2020 4:05 AM End time: 05/08/2020 4:08 AM  Staffing Anesthesiologist: Leilani Able, MD Performed: anesthesiologist   Preanesthetic Checklist Completed: patient identified, IV checked, site marked, risks and benefits discussed, surgical consent, monitors and equipment checked, pre-op evaluation and timeout performed  Epidural Patient position: sitting Prep: DuraPrep and site prepped and draped Patient monitoring: continuous pulse ox and blood pressure Approach: midline Location: L3-L4 Injection technique: LOR air  Needle:  Needle type: Tuohy  Needle gauge: 17 G Needle length: 9 cm and 9 Needle insertion depth: 5 cm cm Catheter type: closed end flexible Catheter size: 19 Gauge Catheter at skin depth: 10 cm Test dose: negative and Other  Assessment Events: blood not aspirated, injection not painful, no injection resistance, no paresthesia and negative IV test  Additional Notes Reason for block:procedure for pain

## 2020-05-08 NOTE — Op Note (Signed)
Cesarean Section Procedure Note -Amanda Cameron  05/08/2020 Procedure: Primary Low Transverse Cesarean Section   Indications: 39.3 wks IOL for IUGR. Cytotec x 3 doses, then cervical balloon and pitocin. Cervix at 4 cm since balloon expelled after 11 hours consistent with arrest of dilation and descent.   Pre-operative Diagnosis: Arrest of dilation and descent   Post-operative Diagnosis: Same   Surgeon: Shea Evans, MD    Assistants: None except scrub technician   Anesthesia: Epidural   Procedure Details:  The patient was seen in the Labor Room. The risks, benefits, complications, treatment options, and expected outcomes were discussed with the patient. The patient concurred with the proposed plan, giving informed consent. identified as [name] and the procedure verified as C-Section Delivery. She was brought to the OR. A Time Out was held and the above information confirmed. 2 gm Ancef given. After induction of anesthesia, the patient was draped and prepped in the usual sterile manner, foley was draining urine well.  A pfannenstiel incision was made and carried down through the subcutaneous tissue to the fascia. Fascial incision was made and extended transversely. The fascia was separated from the underlying rectus tissue superiorly and inferiorly. The peritoneum was identified and entered. Peritoneal incision was extended longitudinally. Alexis-O retractor placed. The utero-vesical peritoneal reflection was incised transversely and the bladder flap was bluntly freed from the lower uterine segment. A low transverse uterine incision was made. Amniotomy noted meconium. Baby was in upper pelvis. Delivered from cephalic presentation was a Female infant with vigorous cry at 9.51 AM on 05/08/20.  Caput was noted on her anterior aspect of head reaching her forehead. Apgar scores of 9 at one minute and 9 at five minutes. Delayed cord clamping done at 1 minute and baby handed to NICU team in attendance.  Cord ph was not sent. Cord blood was obtained for evaluation. The placenta was removed Intact and appeared normal. The uterine outline, tubes and ovaries appeared normal. The uterine incision was closed with running locked sutures of . A second imbricating layer sutured. Hemostasis was observed. Alexis retractor removed. Peritoneal closure done with 2-0 Vicryl.  The fascia was then reapproximated with running sutures of 0Vicryl. The subcuticular closure was performed using 2-0plain gut. The skin was closed with 4-0Vicryl. Steristrips and honeycomb dressings placed.  Instrument, sponge, and needle counts were correct prior the abdominal closure and were correct at the conclusion of the case.   Findings: Female infant delivered cephalic from [facility] hysterotomy at 9.51 am and 2 layer closure of hysterotomy performed. Apgars 9 and 9    Estimated Blood Loss: 213 cc  Total IV Fluids: 1100 cc LR  Urine Output: 100CC OF clear urine  Specimens: cord blood   Complications: no complications  Disposition: PACU - hemodynamically stable.   Maternal Condition: stable   Baby condition / location:  Couplet care / Skin to Skin  Attending Attestation: I performed the procedure.   Signed: Surgeon(s): Shea Evans, MD

## 2020-05-08 NOTE — Progress Notes (Signed)
S: Feeling some cramping. Expresses frustration regarding the length of induction process. Husband at the bedside providing support. Discussed the R/B/A of AROM for labor induction and patient consents to procedure.   O: Vitals:   05/07/20 2330 05/08/20 0000 05/08/20 0030 05/08/20 0057  BP: (!) 127/57 105/69 (!) 108/58   Pulse: 85 73 78   Resp: 18 16 18    Temp:    98.1 F (36.7 C)  TempSrc:    Oral  Weight:      Height:       FHT:  FHR: 140 bpm, variability: moderate,  accelerations:  Present,  decelerations:  Absent UC:   irregular, every 2-5 minutes, mild SVE:   Dilation: 4 Effacement (%): 50 Station: -2 Exam by:: [email or web address], CNM   AROM with moderate meconium at 0241  A / P: Induction of labor due to IUGR, s/p Cytotec and Foley balloon for cervical ripening, Pitocin infusing at 14mu  Fetal Wellbeing:  Category I Pain Control:  Labor support without medications Anticipated MOD:  Guarded   Patient may have epidural upon request. Continue to increase Pitocin 2x2 until adequate contractions are achieved. Recheck SVE in 4 hours or sooner if necessary.   Dr. 18m updated on patient status and plan of care.   Juliene Pina, CNM, MSN 05/08/2020, 2:49 AM

## 2020-05-08 NOTE — Progress Notes (Signed)
S: Doing well since taking epidural, rested.  O: BP (!) 101/57   Pulse 71   Temp 98 F (36.7 C) (Oral)   Resp 18   Ht 5\' 2"  (1.575 m)   Wt 97.8 kg   BMI 39.42 kg/m   FHR: 140 bpm, variability: moderate,  accelerations:  Present,  decelerations:  Absent UC:   regular, every 3 min (external monitor), palpate moderate  SVE:   Dilation: 4 Effacement (%): 60 Station: -2 Exam by:: Dr. [email or web address]  Caput noted, no descent at all   A / P: Induction of labor due to IUGR, s/p Cytotec x 3, Foley balloon and now Pitocin. Moderate meconium at AROM  FHT cat I Repeat exam at 8.30 am and reassess if arrest of dilatation and descent, need to C/section   Amanda Cameron, 05/08/2020, 8:01 AM

## 2020-05-08 NOTE — Social Work (Signed)
MOB was referred for history of depression and anxiety.   * Referral screened out by Clinical Social Worker because none of the following criteria appear to apply:  ~ History of anxiety/depression during this pregnancy, or of post-partum depression following prior delivery. ~ Diagnosis of anxiety and/or depression within last 3 years. CSW reviewed chart and does not note a diagnosis of anxiety or depression. OR * MOB's symptoms currently being treated with medication and/or therapy.  Please contact the Clinical Social Worker if needs arise, by MOB request, or if MOB scores greater than 9/yes to question 10 on Edinburgh Postpartum Depression Screen.  Marrion Accomando, LCSWA Clinical Social Work [facility]  [phone] 

## 2020-05-08 NOTE — Anesthesia Preprocedure Evaluation (Addendum)
Anesthesia Evaluation  Patient identified by MRN, date of birth, ID band Patient awake    Reviewed: Allergy & Precautions, H&P , NPO status , Patient's Chart, lab work & pertinent test results  Airway Mallampati: II  TM Distance: >3 FB Neck ROM: Full    Dental no notable dental hx. (+) Teeth Intact, Dental Advisory Given   Pulmonary neg pulmonary ROS,    Pulmonary exam normal breath sounds clear to auscultation       Cardiovascular negative cardio ROS Normal cardiovascular exam Rhythm:Regular Rate:Normal     Neuro/Psych negative neurological ROS  negative psych ROS   GI/Hepatic negative GI ROS, Neg liver ROS,   Endo/Other  Morbid obesity  Renal/GU negative Renal ROS  negative genitourinary   Musculoskeletal negative musculoskeletal ROS (+)   Abdominal (+) + obese,   Peds negative pediatric ROS (+)  Hematology negative hematology ROS (+)   Anesthesia Other Findings   Reproductive/Obstetrics (+) Pregnancy                           Anesthesia Physical Anesthesia Plan  ASA: III  Anesthesia Plan: Epidural   Post-op Pain Management:    Induction:   PONV Risk Score and Plan: 2  Airway Management Planned: Natural Airway  Additional Equipment:   Intra-op Plan:   Post-operative Plan:   Informed Consent: I have reviewed the patients History and Physical, chart, labs and discussed the procedure including the risks, benefits and alternatives for the proposed anesthesia with the patient or authorized representative who has indicated his/her understanding and acceptance.     Dental Advisory Given  Plan Discussed with: Anesthesiologist  Anesthesia Plan Comments: (  )      Anesthesia Quick Evaluation

## 2020-05-08 NOTE — Anesthesia Postprocedure Evaluation (Signed)
Anesthesia Post Note  Patient: Amanda Cameron  Procedure(s) Performed: CESAREAN SECTION (N/A )     Patient location during evaluation: Mother Baby Anesthesia Type: Epidural Level of consciousness: awake and alert Pain management: pain level controlled Vital Signs Assessment: post-procedure vital signs reviewed and stable Respiratory status: spontaneous breathing, nonlabored ventilation and respiratory function stable Cardiovascular status: stable Postop Assessment: no headache, no backache and epidural receding Anesthetic complications: no   No complications documented.  Last Vitals:  Vitals:   05/08/20 1445 05/08/20 1552  BP: 105/62   Pulse: 70   Resp:  17  Temp:  36.8 C  SpO2: 96% 99%    Last Pain:  Vitals:   05/08/20 1340  TempSrc: Oral  PainSc:    Pain Goal:                   Fraya Ueda

## 2020-05-08 NOTE — Lactation Note (Signed)
This note was copied from a baby's chart. Lactation Consultation Note  Patient Name: Amanda Cameron Date: 05/08/2020 Reason for consult: Initial assessment;Mother's request;Difficult latch;Primapara;1st time breastfeeding;Term;Infant < 6lbs;Other (Comment) (IVF due to low sperm count) Age:36 hours  Maternal Data Has patient been taught Hand Expression?: Yes Does the patient have breastfeeding experience prior to this delivery?: No  Feeding Mother's Current Feeding Choice: Breast Milk  LATCH Score Latch: Repeated attempts needed to sustain latch, nipple held in mouth throughout feeding, stimulation needed to elicit sucking reflex.  Audible Swallowing: None  Type of Nipple: Flat  Comfort (Breast/Nipple): Soft / non-tender  Hold (Positioning): Assistance needed to correctly position infant at breast and maintain latch.  LATCH Score: 5   Lactation Tools Discussed/Used Tools: Shells;Pump;Flanges Flange Size: 24 Breast pump type: Double-Electric Breast Pump Pump Education: Setup, frequency, and cleaning;Milk Storage Reason for Pumping: increase stimulation Pumping frequency: every 3 hours for 15 minutes  Interventions Interventions: Breast feeding basics reviewed;Support pillows;Education;Assisted with latch;Position options;Skin to skin;Expressed milk;Breast massage;Hand express;Shells;Pre-pump if needed;DEBP;Breast compression;Adjust position  Discharge [facility] Program: No  Consult Status Consult Status: Follow-up Date: 05/09/20 Follow-up type: In-patient    Charleston Hankin  Nicholson-Springer 05/08/2020, 5:34 PM

## 2020-05-08 NOTE — Progress Notes (Signed)
IOL for IUGR, admitted at 1.30 am on 2/15, Cytotec x 3 doses (3.25 am, 7.30 am and 1 pm), then cervical foley at 3 pm and pitocin since 5 pm.  Cervical foley expelled at 10 pm last night, Cx 4 cm and 60%, AROM (moderate mec) at 2.30 AM, still 4 cm/ 60%, exam at 7.30 am unchanged at 4 cm and now at 9 am, still at 4 cm and 60%, caput noted and stn at -2 persists.  FHT 135-150/ mod variab/ + accels no decels cat I   Arrest of dilatation and descent. Recommend C-section.  Risks/complications of surgery reviewed incl infection, bleeding, damage to internal organs including bladder, bowels, ureters, blood vessels, other risks from anesthesia, VTE and delayed complications of any surgery, complications in future surgery reviewed. Also discussed neonatal complications incl difficult delivery, laceration, vacuum assistance, TTN etc. Pt understands and agrees, all concerns addressed.    V.Silvano Garofano MD

## 2020-05-08 NOTE — Lactation Note (Signed)
This note was copied from a baby's chart. Lactation Consultation Note  Patient Name: Amanda Cameron [id number] Date: 05/08/2020 Reason for consult: Initial assessment;Mother's request;Difficult latch;Primapara;1st time breastfeeding;Term;Infant < 6lbs;Other (Comment) (IVF due to low sperm count) Age:37 hours Infant attempting to latch but nipples are flat and some areolae edema. Infant latches with a few sucks and pops off. Mom set up DEBP pre pumped for 10 minutes only beads of colostrum noted.   Mom has flat nipples with areola edema. Mom given instructions to pre pump for 5-10 minutes before latching to get her nipples out. Breast shells also provided and instructions on usage, assembly and cleaning reviewed.   Plan 1. To feed based on cues 8-12x in 24 hr period no more than 3 hours without an attempt.          2. LPTI guidelines to reduce calorie loss reviewed with parents. Mom taught hand expression and how to offer EBM via spoon. Mom to pump using DEBP to increase stimulation q 3 hours for 15 minutes.        3. I and O sheet reviewed        4. [facility] brochure of inpatient and outpatient services reviewed.  All questions answered at the end of the visit.  LC alerted RN, Phillips Hay that infant has latched but did not initiate a suck to show signs of milk transfer.  Mom to offer EBM via spoon following pumping for now.   Maternal Data Has patient been taught Hand Expression?: Yes Does the patient have breastfeeding experience prior to this delivery?: No  Feeding Mother's Current Feeding Choice: Breast Milk  LATCH Score Latch: Repeated attempts needed to sustain latch, nipple held in mouth throughout feeding, stimulation needed to elicit sucking reflex.  Audible Swallowing: A few with stimulation  Type of Nipple: Flat  Comfort (Breast/Nipple): Soft / non-tender  Hold (Positioning): Assistance needed to correctly position infant at breast and maintain latch.  LATCH Score:  6   Lactation Tools Discussed/Used Tools: Shells;Pump;Flanges Flange Size: 24 Breast pump type: Double-Electric Breast Pump Pump Education: Setup, frequency, and cleaning;Milk Storage Reason for Pumping: increase stimulation Pumping frequency: every 3 hours for 15 minutes  Interventions Interventions: Breast feeding basics reviewed;Support pillows;Education;Assisted with latch;Position options;Skin to skin;Expressed milk;Breast massage;Hand express;Shells;Pre-pump if needed;DEBP;Breast compression;Adjust position  Discharge [facility] Program: No  Consult Status Consult Status: Follow-up Date: 05/09/20 Follow-up type: In-patient    Trinisha Paget  Nicholson-Springer 05/08/2020, 5:27 PM

## 2020-05-08 NOTE — Transfer of Care (Signed)
Immediate Anesthesia Transfer of Care Note  Patient: Amanda Cameron  Procedure(s) Performed: CESAREAN SECTION (N/A )  Patient Location: PACU  Anesthesia Type:Epidural  Level of Consciousness: awake, alert  and oriented  Airway & Oxygen Therapy: Patient Spontanous Breathing  Post-op Assessment: Report given to RN and Post -op Vital signs reviewed and stable  Post vital signs: Reviewed and stable  Last Vitals:  Vitals Value Taken Time  BP 116/76 05/08/20 1042  Temp    Pulse 90 05/08/20 1044  Resp 9 05/08/20 1044  SpO2 100 % 05/08/20 1044  Vitals shown include unvalidated device data.  Last Pain:  Vitals:   05/08/20 0730  TempSrc:   PainSc: 0-No pain         Complications: No complications documented.

## 2020-05-09 DIAGNOSIS — D62 Acute posthemorrhagic anemia: Secondary | ICD-10-CM | POA: Diagnosis not present

## 2020-05-09 DIAGNOSIS — O365990 Maternal care for other known or suspected poor fetal growth, unspecified trimester, not applicable or unspecified: Secondary | ICD-10-CM | POA: Diagnosis not present

## 2020-05-09 LAB — CBC
HCT: 32 % — ABNORMAL LOW (ref 36.0–46.0)
Hemoglobin: 10.6 g/dL — ABNORMAL LOW (ref 12.0–15.0)
MCH: 29.8 pg (ref 26.0–34.0)
MCHC: 33.1 g/dL (ref 30.0–36.0)
MCV: 89.9 fL (ref 80.0–100.0)
Platelets: 229 10*3/uL (ref 150–400)
RBC: 3.56 MIL/uL — ABNORMAL LOW (ref 3.87–5.11)
RDW: 13.4 % (ref 11.5–15.5)
WBC: 15.9 10*3/uL — ABNORMAL HIGH (ref 4.0–10.5)
nRBC: 0 % (ref 0.0–0.2)

## 2020-05-09 MED ORDER — POLYSACCHARIDE IRON COMPLEX 150 MG PO CAPS
150.0000 mg | ORAL_CAPSULE | Freq: Every day | ORAL | Status: DC
  Administered 2020-05-10 – 2020-05-11 (×2): 150 mg via ORAL
  Filled 2020-05-09 (×2): qty 1

## 2020-05-09 MED ORDER — MAGNESIUM OXIDE 400 (241.3 MG) MG PO TABS
400.0000 mg | ORAL_TABLET | Freq: Every day | ORAL | Status: DC
  Administered 2020-05-10 – 2020-05-11 (×2): 400 mg via ORAL
  Filled 2020-05-09 (×2): qty 1

## 2020-05-09 NOTE — Social Work (Signed)
CSW received consult due to score of 11 on Edinburgh Depression Screen.    CSW met with MOB to complete assessment and offer support. CSW introduced self and role. CSW observed MOB holding infant '[name]' and FOB also present in the room. CSW asked to speak with MOB alone to respect privacy. FOB was understanding and exited the room. CSW informed MOB of the reason for consult and assessed current emotions. MOB reported she is feeling great. MOB stated that aside from nausea, she also had a good pregnancy. MOB disclosed her only worries have been ensuring everything is going well for infant, considering she is a first time mom. CSW expressed understanding. MOB denies any mental health diagnosis and reported FOB is a great support. MOB denies any current SI, HI or being involved in DV.   CSW provided education regarding Baby Blues versus PMADs. CSW encouraged MOB to evaluate her mental health throughout the postpartum period with the use of the New Mom Checklist and to notify a medical professional if symptoms arise.   CSW reviewed SIDS education with MOB. MOB expressed understanding and reported infant will sleep in a crib. MOB identified [facility] for follow-up care and denies any barriers. MOB expressed no additional needs at this time.   CSW identifies no additional needs or barriers to discharge.   Amanda Cameron, MSW, LCSWA Clinical Social Work [facility] [phone] 

## 2020-05-09 NOTE — Progress Notes (Addendum)
POSTOPERATIVE DAY # 1 S/P Primary LTCS for arrest of dilation and descent s/p IOL for IUGR and velamentous cord insertion, baby girl "[name]"   S:         Reports feeling okay, c/o some incisional pain, but managed well with Toradol and Tylenol.              Tolerating po intake / no nausea / no vomiting / + flatus / no BM  Denies dizziness, SOB, or CP             Bleeding is light             Up ad lib / ambulatory/ voiding x1 since foley removal, adequate amount  Newborn breast and formula feeding - peds monitoring weight and bilirubin closely    O:  VS: BP (!) 92/56 (BP Location: Left Arm)   Pulse 69   Temp 97.7 F (36.5 C) (Axillary)   Resp 16   Ht 5\' 2"  (1.575 m)   Wt 97.8 kg   SpO2 100%   Breastfeeding Unknown   BMI 39.42 kg/m  Patient Vitals for the past 24 hrs:  BP Temp Temp src Pulse Resp SpO2  05/09/20 0750 (!) 92/56 97.7 F (36.5 C) Axillary 69 16 100 %  05/09/20 0319 109/64 98.2 F (36.8 C) Axillary 70 17 100 %  05/08/20 2327 (!) 98/58 97.9 F (36.6 C) Oral 73 18 100 %  05/08/20 2240 -- -- -- -- -- 100 %  05/08/20 2006 107/77 98.1 F (36.7 C) Oral 84 18 99 %  05/08/20 1752 -- -- -- -- -- 98 %  05/08/20 1552 -- 98.2 F (36.8 C) -- -- 17 99 %  05/08/20 1445 105/62 -- -- 70 -- 96 %  05/08/20 1340 92/62 98 F (36.7 C) Oral 88 18 99 %  05/08/20 1235 110/60 98 F (36.7 C) Oral 75 18 98 %  05/08/20 1130 107/73 -- -- 77 15 100 %  05/08/20 1115 99/65 97.9 F (36.6 C) -- 83 18 100 %    LABS:              Recent Labs    05/07/20 0259 05/09/20 0536  WBC 13.6* 15.9*  HGB 12.7 10.6*  PLT 253 229               Bloodtype: --/--/A POS (02/15 0325)  Rubella: Immune (08/03 0000)                                             I&O: Intake/Output      02/16 0701 02/17 0700 02/17 0701 02/18 0700   I.V. (mL/kg) 1200 (12.3)    Total Intake(mL/kg) 1200 (12.3)    Urine (mL/kg/hr) 2000 (0.9)    Blood 242    Total Output 2242    Net -1042         Flu: UTD Tdap:  UTD Covid: UTD             Physical Exam:             Alert and Oriented X3  Lungs: Clear and unlabored  Heart: regular rate and rhythm / no murmurs  Abdomen: soft, non-tender, moderate gaseous distention, active bowel sounds              Fundus: firm, non-tender, below umbilicus  Dressing: honeycomb with steri-strips c/d/i              Incision:  approximated with sutures / no erythema / no ecchymosis / no drainage  Perineum: intact  Lochia: appropriate   Extremities: trace LE edema, no calf pain or tenderness  A/P:     POD # 1 S/P Primary LTCS            ABL Anemia    - stable, asymptomatic   - Plan to begin Niferex 150mg  PO daily and Magnesium oxide 400mg  PO daily tomorrow with bowel function improves  EPDS score 11   - s/p CSW consult    - Plan close f/u PP   Routine postoperative care              Lactation support encouraged   Advised void attempt every 2-3 hrs   Warm liquids, ambulation to promote bowel motility   Continue current care  Anticipate d/c home Saturday  , MSN, CNM [facility]

## 2020-05-09 NOTE — Lactation Note (Signed)
This note was copied from a baby's chart. Lactation Consultation Note  Patient Name: Amanda Cameron [id number] Date: 05/09/2020 Reason for consult: Follow-up assessment Age:37 hours   P1, Mother has large breast with generalized edema , areola edema and flat nipples. Assist mother with placing bra and shells.  Used a #24 NS infant took 2 ml with a curved tip syringe through the NS. Infant is jaundice and plan is to be put on photo tx.  Stressed importance of pumping  Every 2-3 hours for 15 mins.  Mother receptive to all teaching.   Advised mother to cue base feed infant and to supplement infant after breastfeed with ebm and formula after breastfeeding.  LC assist with bottle feeding infant , infant refused bottle. Infant tongue thrust and was bitting nipple. Unable to get infant to take any volume. Informed staff RN.  Mother to page [facility] for assistance with next feeding.  Father of infant to bottle feed infant   Maternal Data    Feeding Mother's Current Feeding Choice: Breast Milk Nipple Type: Extra Slow Flow  LATCH Score Latch: Repeated attempts needed to sustain latch, nipple held in mouth throughout feeding, stimulation needed to elicit sucking reflex.  Audible Swallowing: A few with stimulation  Type of Nipple: Flat  Comfort (Breast/Nipple): Soft / non-tender  Hold (Positioning): Assistance needed to correctly position infant at breast and maintain latch.  LATCH Score: 6   Lactation Tools Discussed/Used Tools: Nipple Shields Nipple shield size: 24  Interventions    Discharge    Consult Status Consult Status: Follow-up Date: 05/09/20 Follow-up type: In-patient    Stevan Born [facility] 05/09/2020, 9:11 AM

## 2020-05-09 NOTE — Lactation Note (Signed)
This note was copied from a baby's chart. Lactation Consultation Note Attempted to see mom but she was sleeping .  Patient Name: Amanda Cameron [id number] Date: 05/09/2020   Age:37 hours  Maternal Data    Feeding Nipple Type: Extra Slow Flow  LATCH Score                    Lactation Tools Discussed/Used    Interventions    Discharge    Consult Status      Charyl Dancer 05/09/2020, 6:39 AM

## 2020-05-10 DIAGNOSIS — Z9189 Other specified personal risk factors, not elsewhere classified: Secondary | ICD-10-CM

## 2020-05-10 NOTE — Progress Notes (Signed)
Subjective: POD# 2 Live born female  Birth Weight: 5 lb 5.9 oz (2435 g) APGAR: 9, 9  Newborn Delivery   Birth date/time: 05/08/2022 09:51:00 Delivery type: C-Section, Low Transverse Trial of labor: Yes C-section categorization: Primary     Baby name: [name] Delivering provider: MODY, VAISHALI   Feeding: bottle  Pain control at delivery: Epidural   Reports feeling well overall with moderate pain when moving. Reports that pain medicine is helping with pain. Baby is under bili lights and crying often. Bottle feeding exclusively. Husband at the bedside providing support.   Patient reports tolerating PO.   Pain controlled with ibuprofen (OTC) and narcotic analgesics including Oxy IR Denies HA/SOB/C/P/N/V/dizziness. Flatus yes. She reports vaginal bleeding as normal, without clots. She is ambulating and urinating without difficulty.     Objective:  Vitals:   05/09/20 0750 05/09/20 1332 05/09/20 2104 05/10/20 0500  BP: (!) 92/56 (!) 98/58 116/79 109/89  Pulse: 69 70 74 63  Resp: 16 18 17 18   Temp: 97.7 F (36.5 C) 98 F (36.7 C) 98.1 F (36.7 C) 98.2 F (36.8 C)  TempSrc: Axillary Oral Oral Oral  SpO2: 100%  100% 100%  Weight:      Height:         Intake/Output Summary (Last 24 hours) at 05/10/2020 1111 Last data filed at 05/09/2020 1911 Gross per 24 hour  Intake 0 ml  Output 900 ml  Net -900 ml      Recent Labs    05/09/20 0536  WBC 15.9*  HGB 10.6*  HCT 32.0*  PLT 229    Blood type: --/--/A POS (02/15 0325)  Rubella: Immune (08/03 0000)   Vaccines:   TDaP          UTD           Flu             UTD                      COVID-19 UTD  Physical Exam:  General: alert and cooperative CV: Regular rate and rhythm Resp: clear Abdomen: soft, nontender, normal bowel sounds Incision: clean, dry and intact Uterine Fundus: firm, below umbilicus, nontender Lochia: minimal Ext: extremities normal, atraumatic, no cyanosis or edema and edema 1+ non-pitting edema to  lower extremities  Assessment/Plan: 35 y.o.   POD# 2. G1P1001                  Principal Problem:   Postpartum care following cesarean delivery (2/16)  Encourage rest when baby rests  Encourage to ambulate  Routine post-op care Active Problems:   Encounter for induction of labor   Delivery by emergency cesarean   Acute blood loss anemia  Continue PO Niferex and mag oxide  Asymptomatic   IUGR (intrauterine growth restriction) affecting care of mother   At risk for postpartum depression  Close F/U postpartum for mood  Anticipate discharge tomorrow if baby is discharged, discharge to boarding tomorrow if baby remains inpatient.   04-17-1973, CNM, MSN 05/10/2020, 11:11 AM

## 2020-05-10 NOTE — Lactation Note (Signed)
This note was copied from a baby's chart. Lactation Consultation Note Baby 40 hrs old. FOB stated baby is doing much better taking milk from bottle. Mom stated she didn't have milk. She is pumping and wearing suction cups.  Mom stated she is giving formula until her milk comes in and baby is on lights. Parents feels that progress is being made and getting better each day. Encouraged to call for assistance or questions. Praised parents for hard work.  Patient Name: Amanda Cameron [id number] Date: 05/10/2020 Reason for consult: Follow-up assessment;Primapara;Infant < 6lbs;Term Age:67 hours  Maternal Data    Feeding Mother's Current Feeding Choice: Breast Milk and Formula Nipple Type: Slow - flow  LATCH Score                    Lactation Tools Discussed/Used    Interventions    Discharge    Consult Status Consult Status: Follow-up Date: 05/10/20 Follow-up type: In-patient    Charyl Dancer 05/10/2020, 1:52 AM

## 2020-05-10 NOTE — Lactation Note (Signed)
This note was copied from a baby's chart. Lactation Consultation Note  Patient Name: Amanda Cameron [id number] Date: 05/10/2020 Reason for consult: Follow-up assessment;Term Age:37 hours  Follow up to 49 hours old infant with 4.52% weight loss at the time of visit. Infant is receiving phototherapy upon arrival. Per mother, infant is mostly formula formula fed due to bili and weight loss. Mother is  disappointed because she was looking forward to breastfeed this infant.  Talked to mother about formula as an aid to ensure infant's intake. Parents have the formula volume guidelines according to infant's age. Discussed pacing while bottle-feeding, demonstrated technique. Reinforced upright position and frequent burping.  Per parents, infant is only taking 5-10 mL of formula per feeding. Infant started crying and LC offered to demonstrated pacing while feeding infant. Infant took 71mL of 22-cal formula. LC placed infant back to phototherapy.  Mother states she has not been able to collect colostrum when pumping or hand expressing. LC explained it can be normal and reinforced the importance of stimulating breast. Mother explains she has not been able to rest. Promoted self-care as part of mother's healing process.  Infant will not be discharged today as bili will continue to be monitored.   Feeding plan:  1. Feed infant 8-12 time in 24h or following hunger cues.  2. Formula feed following volume guidelines, paced bottle feeding and fullness cues.   3. Pump or hand-express everytime infant is supplemented with formula  4. Monitor voids and stools as signs good intake 5. Encouraged maternal rest, hydration and food intake.  6. Contact Lactation Services or local resources for support, questions or concerns.     All questions answered at this time.   Maternal Data Does the patient have breastfeeding experience prior to this delivery?: No  Feeding Mother's Current Feeding Choice: Breast Milk and  Formula Nipple Type: Nfant Slow Flow (green)  Lactation Tools Discussed/Used Tools: Pump;Flanges;Coconut oil Breast pump type: Double-Electric Breast Pump Pump Education: Milk Storage Reason for Pumping: stimulation and supplementation Pumping frequency: 8-12 times x24h  Interventions Interventions: Breast feeding basics reviewed;Expressed milk;DEBP;Education  Discharge Pump: Personal  Consult Status Consult Status: Follow-up Date: 05/11/20 Follow-up type: In-patient    Nicolaas Savo A Higuera Ancidey 05/10/2020, 12:10 PM

## 2020-05-11 MED ORDER — IBUPROFEN 800 MG PO TABS: 800.0000 mg | ORAL_TABLET | Freq: Four times a day (QID) | ORAL | 0 refills | Status: AC

## 2020-05-11 MED ORDER — OXYCODONE HCL 5 MG PO TABS: 5.0000 mg | ORAL_TABLET | ORAL | 0 refills | Status: AC | PRN

## 2020-05-11 MED ORDER — ACETAMINOPHEN 325 MG PO TABS: 650.0000 mg | ORAL_TABLET | ORAL | Status: AC | PRN

## 2020-05-11 NOTE — Discharge Summary (Signed)
Postpartum Discharge Summary  Date of Service updated 05/11/20     Patient Name: Amanda Cameron DOB: Jun 30, 1985 MRN: [id number]  Date of admission: 05/07/2020 Delivery date:05/08/2020  Delivering provider: MODY, VAISHALI  Date of discharge: 05/11/2020  Admitting diagnosis: Encounter for induction of labor [Z34.90] Delivery by emergency cesarean [O99.892] Intrauterine pregnancy: [redacted]w[redacted]d     Secondary diagnosis:  Principal Problem:   Postpartum care following cesarean delivery (2/16) Active Problems:   Encounter for induction of labor   Delivery by emergency cesarean   Acute blood loss anemia   IUGR (intrauterine growth restriction) affecting care of mother   At risk for postpartum depression  Additional problems: none    Discharge diagnosis: Term Pregnancy Delivered                                              Post partum procedures:n/a Augmentation: Cytotec and IP Foley Complications: None  Hospital course: Induction of Labor With Cesarean Section   35 y.o. yo G1P1001 at [redacted]w[redacted]d was admitted to the hospital 05/07/2020 for induction of labor. Patient had a labor course significant for IOL with cytotec, foley, and pitocin. The patient went for cesarean section due to Arrest of Dilation. Delivery details are as follows: Membrane Rupture Time/Date: 2:41 AM ,05/08/2020   Delivery Method:C-Section, Low Transverse  Details of operation can be found in separate operative Note.  Patient had an uncomplicated postpartum course. She is ambulating, tolerating a regular diet, passing flatus, and urinating well.  Patient is discharged home in stable condition on 05/11/20.      Newborn Data: Birth date:05/08/2020  Birth time:9:51 AM  Gender:Female  Living status:Living  Apgars:9 ,9  Weight:2435 g                                 Magnesium Sulfate received: No BMZ received: No Rhophylac:N/A  Physical exam  Vitals:   05/10/20 0500 05/10/20 1355 05/10/20 1945 05/11/20 0513  BP: 109/89 116/90  118/79 114/80  Pulse: 63 100 77 70  Resp: 18 18 18 18   Temp: 98.2 F (36.8 C) 98.3 F (36.8 C) 97.6 F (36.4 C) 97.7 F (36.5 C)  TempSrc: Oral Oral Oral   SpO2: 100% 100% 100% 100%  Weight:      Height:       General: alert and no distress Lochia: appropriate Uterine Fundus: firm Incision: Healing well with no significant drainage DVT Evaluation: No evidence of DVT seen on physical exam. Labs: Lab Results  Component Value Date   WBC 15.9 (H) 05/09/2020   HGB 10.6 (L) 05/09/2020   HCT 32.0 (L) 05/09/2020   MCV 89.9 05/09/2020   PLT 229 05/09/2020   CMP Latest Ref Rng & Units 10/29/2019  Glucose 70 - 99 mg/dL 99  BUN 6 - 20 mg/dL 6  Creatinine 12/29/2019 - 5.17 mg/dL 6.16  Sodium 0.73 - 710 mmol/L 139  Potassium 3.5 - 5.1 mmol/L 3.5  Chloride 98 - 111 mmol/L 110  CO2 22 - 32 mmol/L 20(L)  Calcium 8.9 - 10.3 mg/dL 9.5   Edinburgh Score: Edinburgh Postnatal Depression Scale Screening Tool 05/08/2020  I have been able to laugh and see the funny side of things. 1  I have looked forward with enjoyment to things. 0  I have blamed myself unnecessarily when  things went wrong. 2  I have been anxious or worried for no good reason. 2  I have felt scared or panicky for no good reason. 2  Things have been getting on top of me. 1  I have been so unhappy that I have had difficulty sleeping. 2  I have felt sad or miserable. 1  I have been so unhappy that I have been crying. 0  The thought of harming myself has occurred to me. 0  Edinburgh Postnatal Depression Scale Total 11      After visit meds:  Allergies as of 05/11/2020   No Known Allergies     Medication List    TAKE these medications   acetaminophen 325 MG tablet Commonly known as: TYLENOL Take 2 tablets (650 mg total) by mouth every 4 (four) hours as needed for mild pain (temperature > 101.5.).   ibuprofen 800 MG tablet Commonly known as: ADVIL Take 1 tablet (800 mg total) by mouth every 6 (six) hours.   oxyCODONE 5  MG immediate release tablet Commonly known as: Oxy IR/ROXICODONE Take 1 tablet (5 mg total) by mouth every 4 (four) hours as needed for moderate pain.   prenatal multivitamin Tabs tablet Take 1 tablet by mouth at bedtime.            Discharge Care Instructions  (From admission, onward)         Start     Ordered   05/11/20 0000  Discharge wound care:       Comments: Sitz baths 2 times /day with warm water x 1 week. May add herbals: 1 ounce dried comfrey leaf* 1 ounce calendula flowers 1 ounce lavender flowers  Supplies can be found online at [employer] sources at [employer], [employer]  1/2 ounce dried uva ursi leaves 1/2 ounce witch hazel blossoms (if you can find them) 1/2 ounce dried sage leaf 1/2 cup sea salt Directions: Bring 2 quarts of water to a boil. Turn off heat, and place 1 ounce (approximately 1 large handful) of the above mixed herbs (not the salt) into the pot. Steep, covered, for 30 minutes.  Strain the liquid well with a fine mesh strainer, and discard the herb material. Add 2 quarts of liquid to the tub, along with the 1/2 cup of salt. This medicinal liquid can also be made into compresses and peri-rinses.   05/11/20 1216           Discharge home in stable condition Infant Feeding: Bottle and Breast Infant Disposition:home with mother Discharge instruction: per After Visit Summary and Postpartum booklet. Activity: Advance as tolerated. Pelvic rest for 6 weeks.  Diet: routine diet Anticipated Birth Control: Unsure Postpartum Appointment:6 weeks Additional Postpartum F/U: 2 weeks  Future Appointments:No future appointments. Follow up Visit:  Follow-up Information    Shea Evans, MD. Schedule an appointment as soon as possible for a visit in 2 week(s).   Specialty: Obstetrics and Gynecology Why: Please make an appointment for 2 weeks postpartum. Contact information: [address] [place] [address] [phone]                    05/11/2020 Glennie Rodda A Mabel Roll, DO

## 2020-05-11 NOTE — Lactation Note (Addendum)
This note was copied from a baby's chart. Lactation Consultation Note  Patient Name: Amanda Cameron [id number] Date: 05/11/2020 Reason for consult: Follow-up assessment;1st time breastfeeding;Infant < 6lbs Age:37 hours   P1, Assisted with latching but baby did not sustain latch. Mother complaining of breasts hurting.  Breasts are filling and mother has not been pumping on a regular basis.  Encouraged pumping q 3 hours for a minimum of 8x per day.  Mother has DEBP at home. Set up DEBP and assisted mother with pumping while massaging breasts.    Maternal Data Has patient been taught Hand Expression?: Yes Does the patient have breastfeeding experience prior to this delivery?: No  Feeding Mother's Current Feeding Choice: Breast Milk and Formula Nipple Type: Slow - flow  Lactation Tools Discussed/Used Breast pump type: Double-Electric Breast Pump Pump Education: Milk Storage Reason for Pumping: < 6 bls Pumping frequency:  (random, recommend min 8 times per day) Pumped volume: 25 mL  Interventions Interventions: Breast feeding basics reviewed;Assisted with latch;Skin to skin;Hand express;Breast compression;Support pillows;DEBP;Education  Discharge Discharge Education: Engorgement and breast care;Warning signs for feeding baby;Outpatient recommendation Pump: Personal;DEBP (evenflow)  Consult Status Consult Status: Complete Date: 05/11/20  Dahlia Byes [facility] 05/11/2020, 10:24 AM

## 2020-05-11 NOTE — Discharge Instructions (Signed)

## 2020-06-19 DIAGNOSIS — O923 Agalactia: Secondary | ICD-10-CM | POA: Diagnosis not present

## 2020-08-26 DIAGNOSIS — M79675 Pain in left toe(s): Secondary | ICD-10-CM | POA: Diagnosis not present

## 2020-08-26 DIAGNOSIS — L509 Urticaria, unspecified: Secondary | ICD-10-CM | POA: Diagnosis not present

## 2020-10-04 DIAGNOSIS — M79675 Pain in left toe(s): Secondary | ICD-10-CM | POA: Diagnosis not present

## 2020-10-04 DIAGNOSIS — L509 Urticaria, unspecified: Secondary | ICD-10-CM | POA: Diagnosis not present

## 2020-10-04 DIAGNOSIS — Z13220 Encounter for screening for lipoid disorders: Secondary | ICD-10-CM | POA: Diagnosis not present

## 2020-10-04 DIAGNOSIS — M545 Low back pain, unspecified: Secondary | ICD-10-CM | POA: Diagnosis not present

## 2020-10-04 DIAGNOSIS — Z0000 Encounter for general adult medical examination without abnormal findings: Secondary | ICD-10-CM | POA: Diagnosis not present

## 2022-01-30 DIAGNOSIS — Z0000 Encounter for general adult medical examination without abnormal findings: Secondary | ICD-10-CM | POA: Diagnosis not present

## 2022-01-30 DIAGNOSIS — Z13220 Encounter for screening for lipoid disorders: Secondary | ICD-10-CM | POA: Diagnosis not present

## 2022-01-30 DIAGNOSIS — Z23 Encounter for immunization: Secondary | ICD-10-CM | POA: Diagnosis not present

## 2022-01-30 DIAGNOSIS — R053 Chronic cough: Secondary | ICD-10-CM | POA: Diagnosis not present

## 2022-01-30 DIAGNOSIS — E669 Obesity, unspecified: Secondary | ICD-10-CM | POA: Diagnosis not present

## 2022-01-30 DIAGNOSIS — N946 Dysmenorrhea, unspecified: Secondary | ICD-10-CM | POA: Diagnosis not present

## 2022-01-30 DIAGNOSIS — G43009 Migraine without aura, not intractable, without status migrainosus: Secondary | ICD-10-CM | POA: Diagnosis not present

## 2022-03-07 IMAGING — US US ABDOMEN LIMITED
1 series · 14 of 25 positions shown · non-contrast
Comparison: None.

CLINICAL DATA: Upper abdominal pain with nausea and vomiting

EXAM:
ULTRASOUND ABDOMEN LIMITED RIGHT UPPER QUADRANT

[Series 1: us abdomen limited ruq · 14 of 48 slices shown]
[im 1/48]
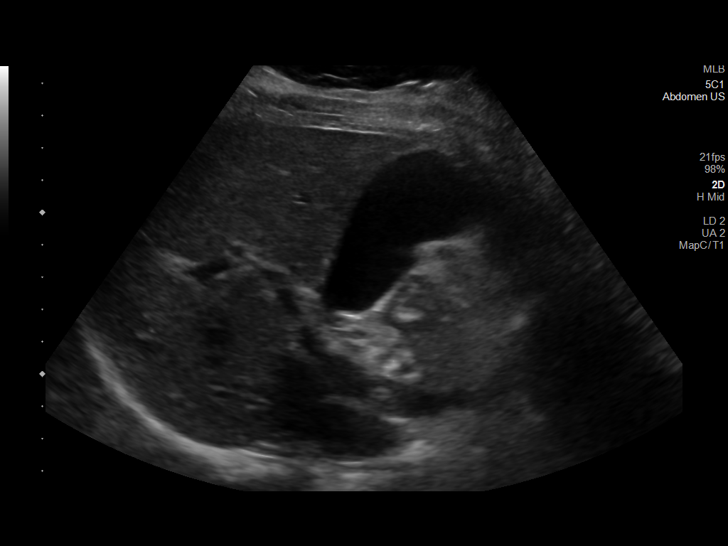
[im 4/48]
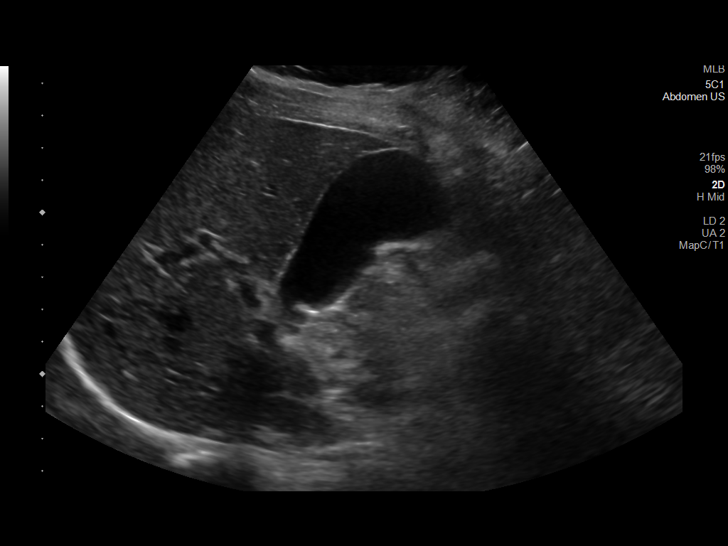
[im 8/48]
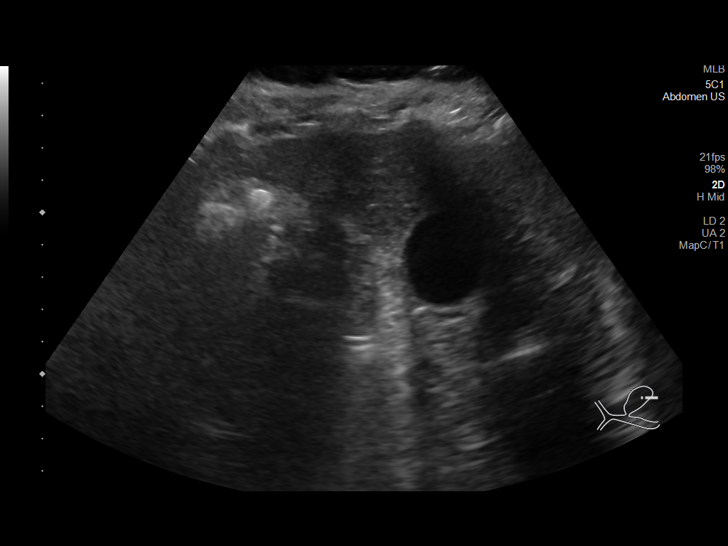
[im 12/48]
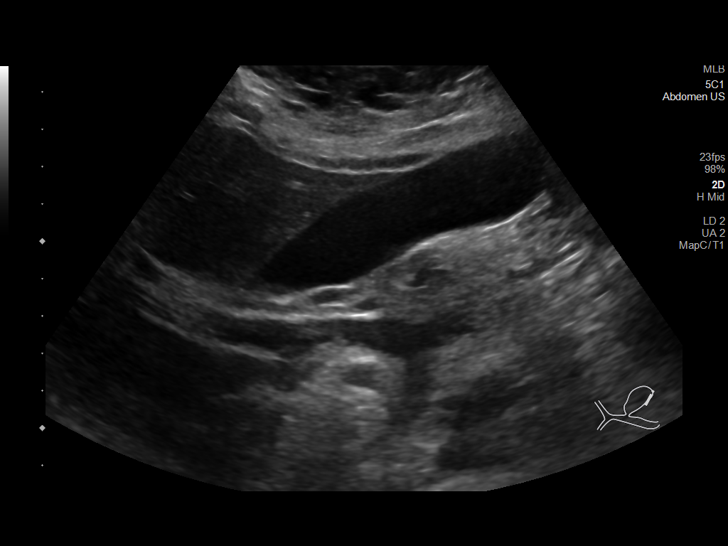
[im 16/48]
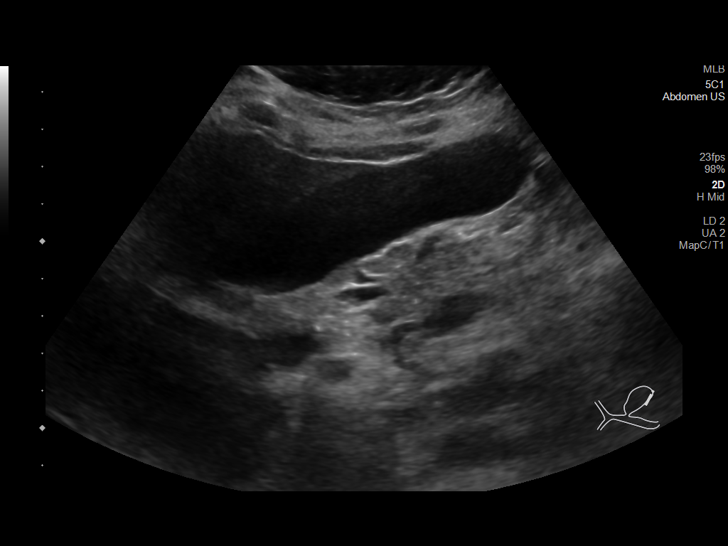
[im 18/48]
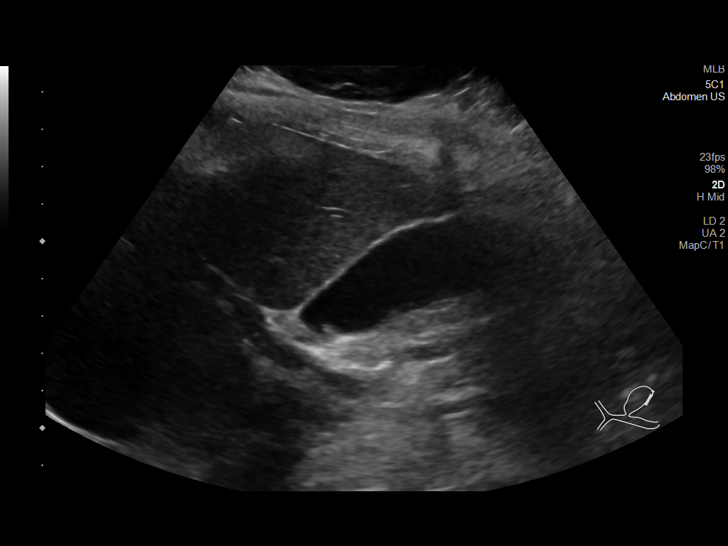
[im 22/48]
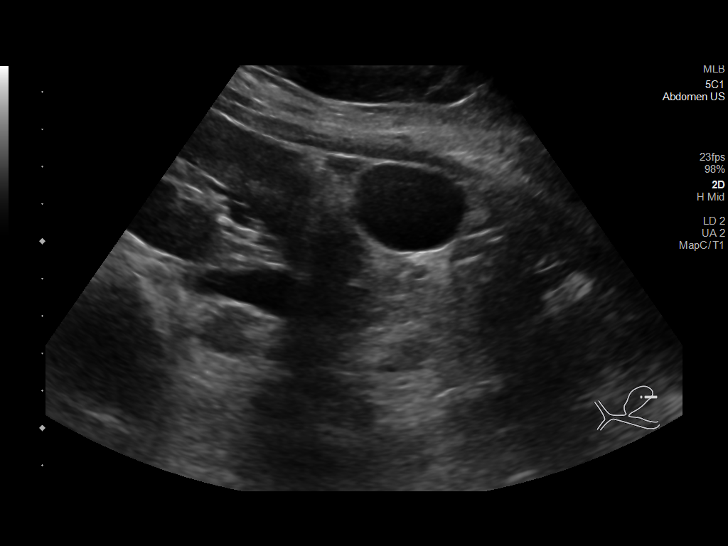
[im 26/48]
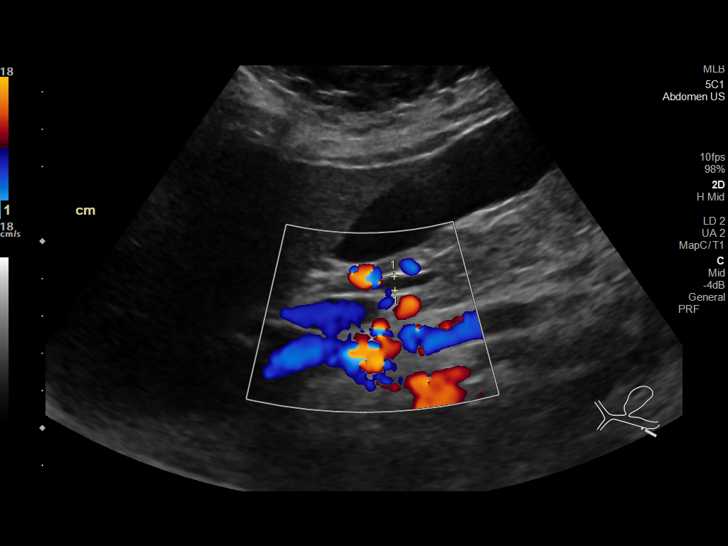
[im 30/48]
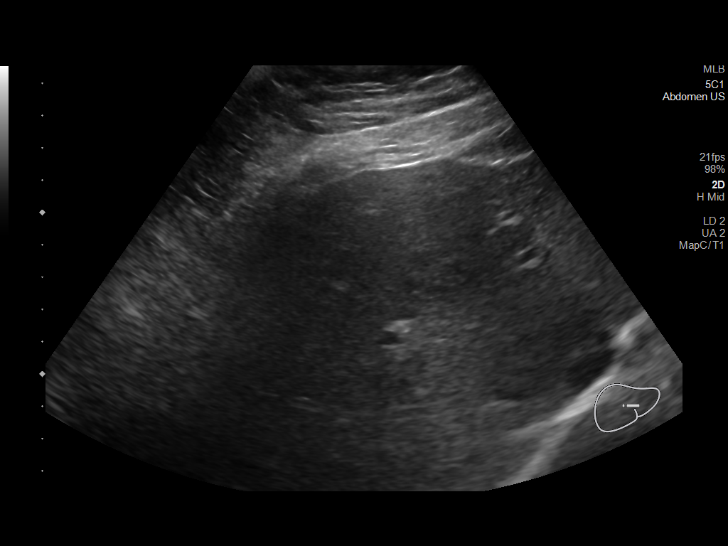
[im 32/48]
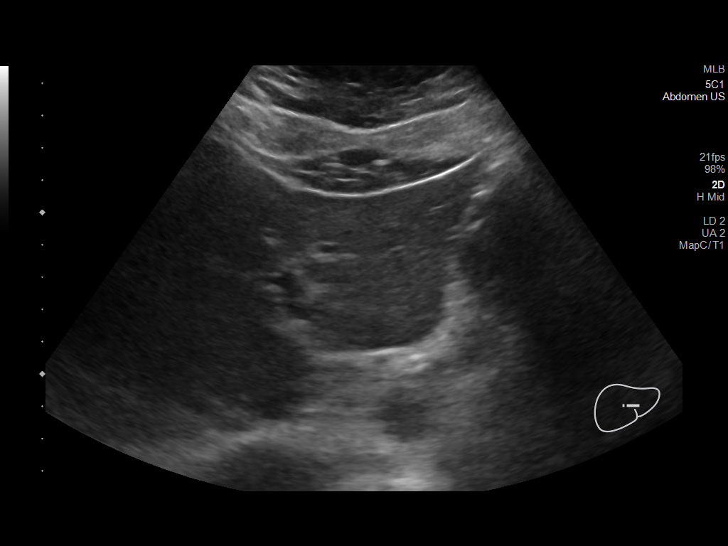
[im 36/48]
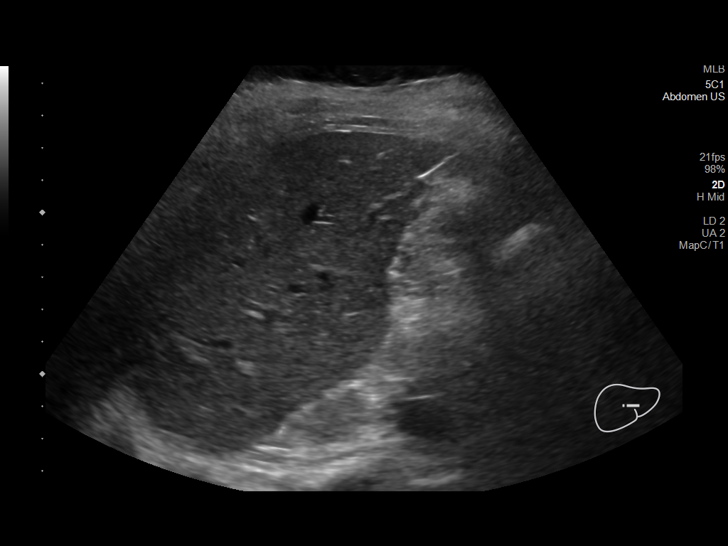
[im 40/48]
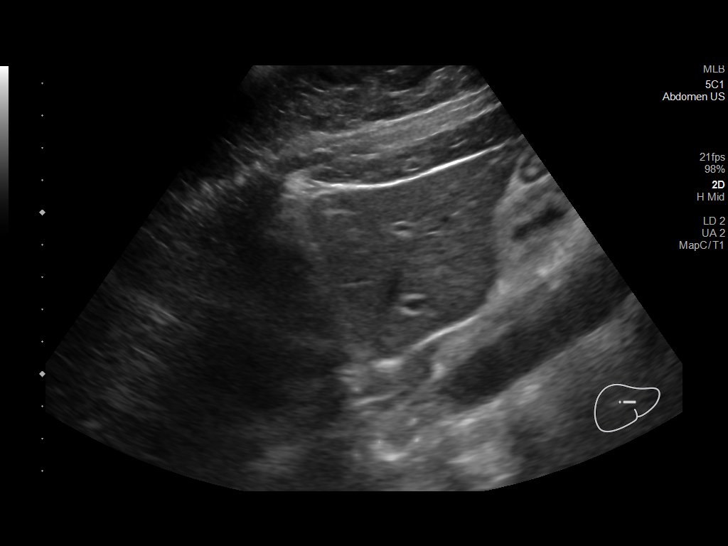
[im 44/48]
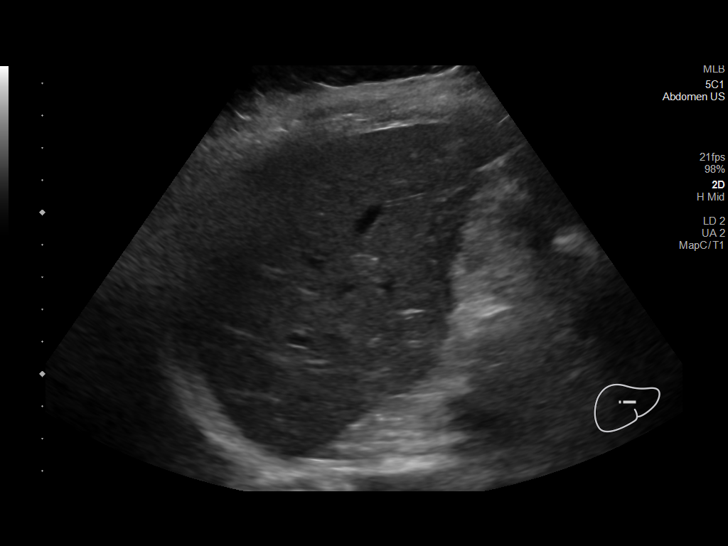
[im 48/48]
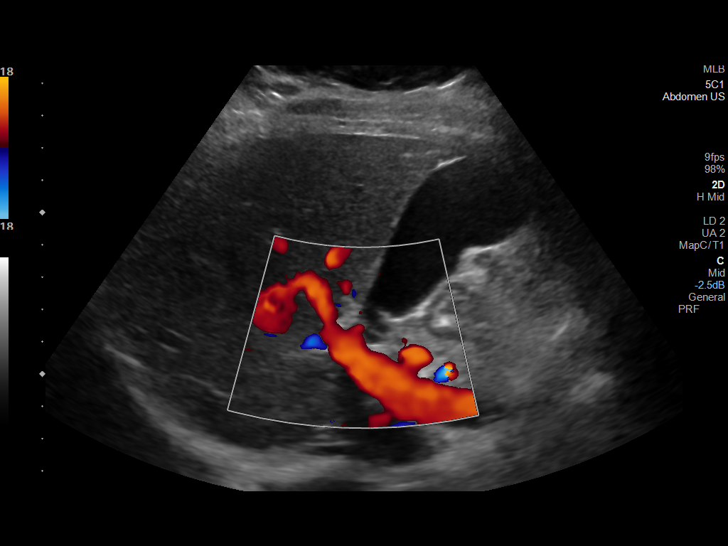

[14 of 25 positions shown; findings below may reference images not displayed]

FINDINGS: Gallbladder:

No gallstones or wall thickening visualized. There is no
pericholecystic fluid. There is slight sludge in gallbladder. No
sonographic Murphy sign noted by sonographer.

Common bile duct:

Diameter: 4 mm. No intrahepatic or extrahepatic biliary duct
dilatation.

Liver:

No focal lesion identified. Within normal limits in parenchymal
echogenicity. Portal vein is patent on color Doppler imaging with
normal direction of blood flow towards the liver.

Other: None.
IMPRESSION: Slight sludge in the gallbladder. No demonstrable gallstones,
gallbladder wall thickening, or pericholecystic fluid.

Study otherwise unremarkable.

## 2023-05-28 DIAGNOSIS — Z13220 Encounter for screening for lipoid disorders: Secondary | ICD-10-CM | POA: Diagnosis not present

## 2023-05-28 DIAGNOSIS — Z23 Encounter for immunization: Secondary | ICD-10-CM | POA: Diagnosis not present

## 2023-05-28 DIAGNOSIS — R519 Headache, unspecified: Secondary | ICD-10-CM | POA: Diagnosis not present

## 2023-05-28 DIAGNOSIS — L720 Epidermal cyst: Secondary | ICD-10-CM | POA: Diagnosis not present

## 2023-05-28 DIAGNOSIS — E669 Obesity, unspecified: Secondary | ICD-10-CM | POA: Diagnosis not present

## 2023-05-28 DIAGNOSIS — Z0000 Encounter for general adult medical examination without abnormal findings: Secondary | ICD-10-CM | POA: Diagnosis not present

## 2023-05-28 DIAGNOSIS — L853 Xerosis cutis: Secondary | ICD-10-CM | POA: Diagnosis not present
